# Patient Record
Sex: Male | Born: 1965 | Race: White | Hispanic: No | Marital: Married | Smoking: Former smoker
Health system: Southern US, Community
[De-identification: ages and names within clinical notes are randomized; demographics above are authoritative.]

## PROBLEM LIST (undated history)

## (undated) DIAGNOSIS — K219 Gastro-esophageal reflux disease without esophagitis: Secondary | ICD-10-CM

## (undated) DIAGNOSIS — F411 Generalized anxiety disorder: Secondary | ICD-10-CM

## (undated) DIAGNOSIS — E041 Nontoxic single thyroid nodule: Secondary | ICD-10-CM

## (undated) DIAGNOSIS — I10 Essential (primary) hypertension: Secondary | ICD-10-CM

## (undated) DIAGNOSIS — I671 Cerebral aneurysm, nonruptured: Secondary | ICD-10-CM

## (undated) HISTORY — DX: Cerebral aneurysm, nonruptured: I67.1

## (undated) HISTORY — DX: Nontoxic single thyroid nodule: E04.1

## (undated) HISTORY — DX: Essential (primary) hypertension: I10

## (undated) HISTORY — PX: VASECTOMY: SHX75

## (undated) HISTORY — DX: Gastro-esophageal reflux disease without esophagitis: K21.9

## (undated) HISTORY — DX: Generalized anxiety disorder: F41.1

---

## 2003-03-03 DIAGNOSIS — I671 Cerebral aneurysm, nonruptured: Secondary | ICD-10-CM

## 2003-03-03 HISTORY — DX: Cerebral aneurysm, nonruptured: I67.1

## 2013-05-05 DIAGNOSIS — H524 Presbyopia: Secondary | ICD-10-CM | POA: Insufficient documentation

## 2013-05-05 DIAGNOSIS — I671 Cerebral aneurysm, nonruptured: Secondary | ICD-10-CM | POA: Insufficient documentation

## 2013-05-05 HISTORY — DX: Presbyopia: H52.4

## 2013-07-14 DIAGNOSIS — M5136 Other intervertebral disc degeneration, lumbar region: Secondary | ICD-10-CM

## 2013-07-14 DIAGNOSIS — M51369 Other intervertebral disc degeneration, lumbar region without mention of lumbar back pain or lower extremity pain: Secondary | ICD-10-CM

## 2013-07-14 DIAGNOSIS — M545 Low back pain, unspecified: Secondary | ICD-10-CM | POA: Insufficient documentation

## 2013-07-14 HISTORY — DX: Other intervertebral disc degeneration, lumbar region: M51.36

## 2013-07-14 HISTORY — DX: Low back pain, unspecified: M54.50

## 2013-07-14 HISTORY — DX: Other intervertebral disc degeneration, lumbar region without mention of lumbar back pain or lower extremity pain: M51.369

## 2013-08-25 DIAGNOSIS — M47816 Spondylosis without myelopathy or radiculopathy, lumbar region: Secondary | ICD-10-CM

## 2013-08-25 HISTORY — DX: Spondylosis without myelopathy or radiculopathy, lumbar region: M47.816

## 2015-12-10 ENCOUNTER — Other Ambulatory Visit: Payer: Self-pay | Admitting: Physical Medicine and Rehabilitation

## 2015-12-10 DIAGNOSIS — M5416 Radiculopathy, lumbar region: Secondary | ICD-10-CM

## 2015-12-10 DIAGNOSIS — Z77018 Contact with and (suspected) exposure to other hazardous metals: Secondary | ICD-10-CM

## 2015-12-17 ENCOUNTER — Ambulatory Visit
Admission: RE | Admit: 2015-12-17 | Discharge: 2015-12-17 | Disposition: A | Discharge status: Home / Self Care | Payer: BLUE CROSS/BLUE SHIELD | Source: Ambulatory Visit | Attending: Physical Medicine and Rehabilitation | Admitting: Physical Medicine and Rehabilitation

## 2015-12-17 DIAGNOSIS — M5416 Radiculopathy, lumbar region: Secondary | ICD-10-CM

## 2015-12-17 DIAGNOSIS — Z77018 Contact with and (suspected) exposure to other hazardous metals: Secondary | ICD-10-CM

## 2017-12-27 DIAGNOSIS — I10 Essential (primary) hypertension: Secondary | ICD-10-CM | POA: Insufficient documentation

## 2018-04-15 DIAGNOSIS — E559 Vitamin D deficiency, unspecified: Secondary | ICD-10-CM | POA: Insufficient documentation

## 2018-04-15 DIAGNOSIS — F411 Generalized anxiety disorder: Secondary | ICD-10-CM | POA: Insufficient documentation

## 2018-04-15 HISTORY — DX: Vitamin D deficiency, unspecified: E55.9

## 2018-07-15 IMAGING — CR DG ORBITS FOR FOREIGN BODY
2 series · 2 of 2 positions shown · non-contrast
Comparison: No prior.

CLINICAL DATA: MRI screen.

EXAM:
ORBITS FOR FOREIGN BODY - 2 VIEW

[w orbit pa (1 of 2)]
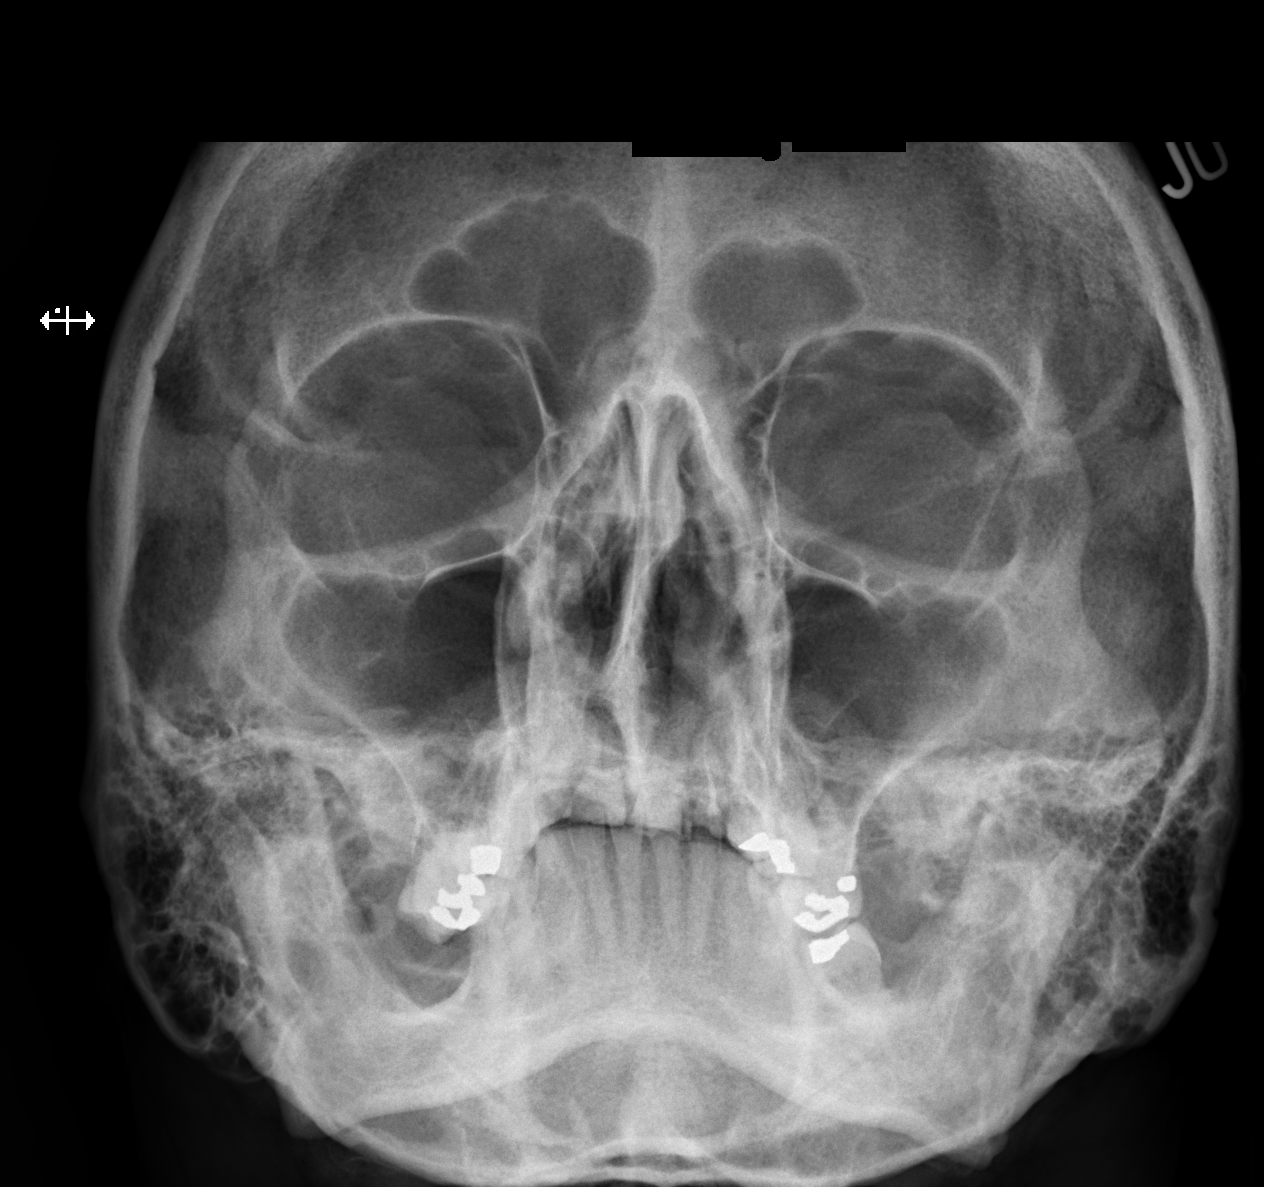

[w orbit pa (2 of 2)]
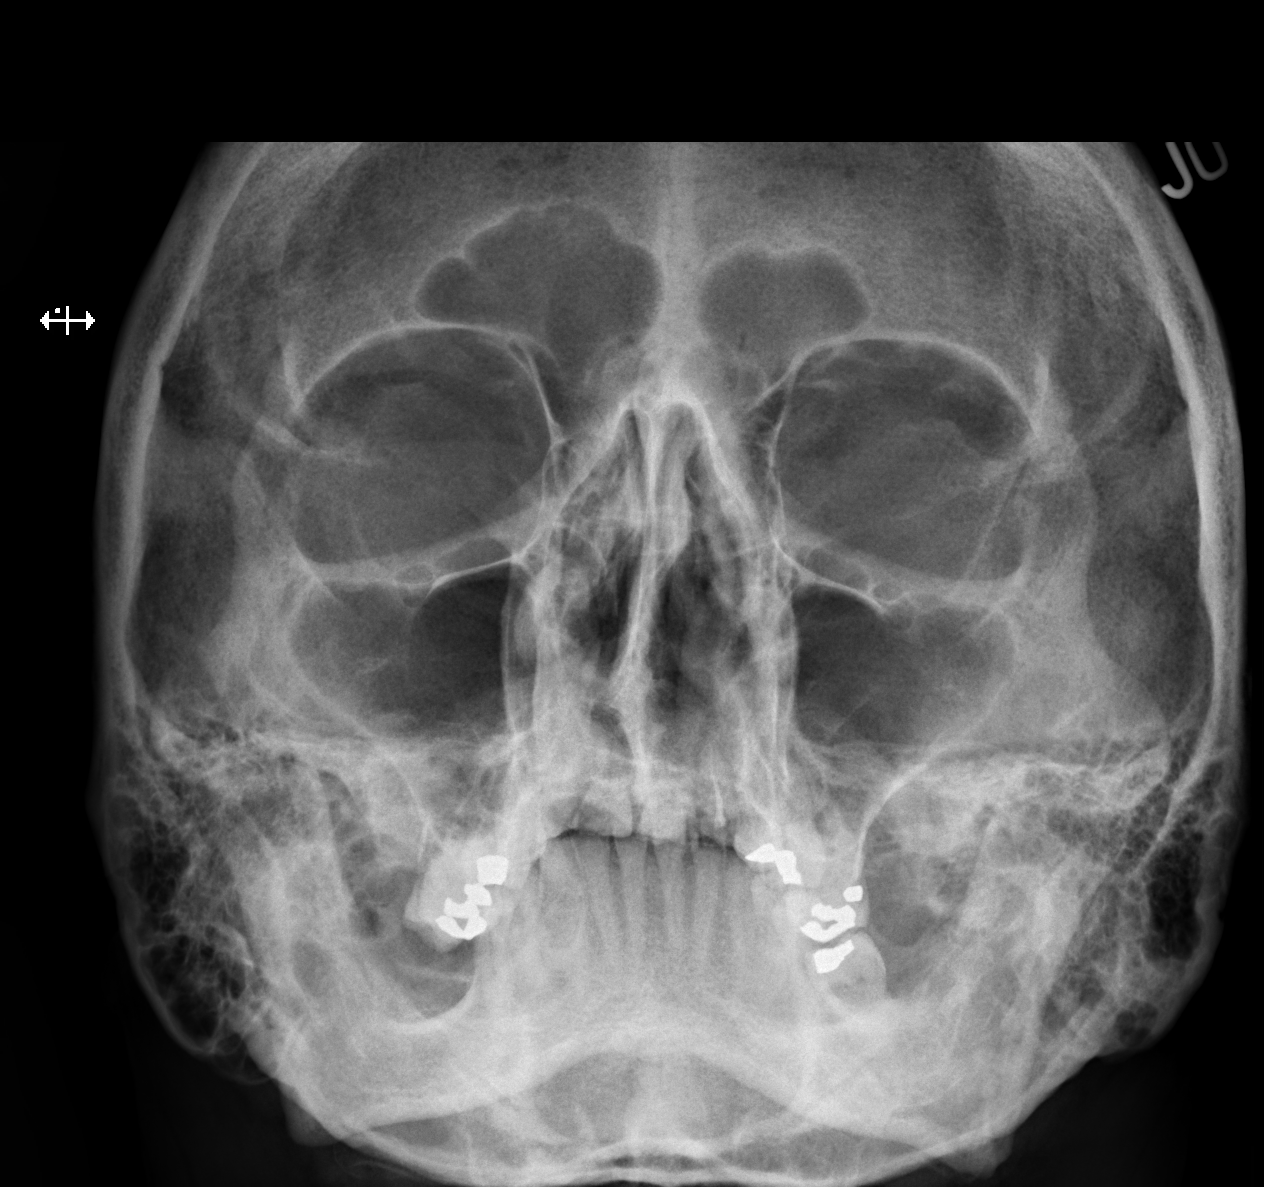

[2 of 2 positions shown; findings below may reference images not displayed]

FINDINGS: No metallic foreign bodies noted.  Patient cleared for MRI.
IMPRESSION: No evidence of metallic foreign body within the orbits.

## 2019-05-04 DIAGNOSIS — G44219 Episodic tension-type headache, not intractable: Secondary | ICD-10-CM | POA: Insufficient documentation

## 2019-05-04 HISTORY — DX: Episodic tension-type headache, not intractable: G44.219

## 2019-05-05 ENCOUNTER — Ambulatory Visit: Payer: BLUE CROSS/BLUE SHIELD | Admitting: Legal Medicine

## 2019-06-02 ENCOUNTER — Ambulatory Visit (INDEPENDENT_AMBULATORY_CARE_PROVIDER_SITE_OTHER): Payer: 59 | Admitting: Legal Medicine

## 2019-06-02 ENCOUNTER — Encounter: Payer: Self-pay | Admitting: Legal Medicine

## 2019-06-02 ENCOUNTER — Other Ambulatory Visit: Payer: Self-pay

## 2019-06-02 VITALS — BP 150/98 | HR 73 | Temp 98.1°F | Wt 247.0 lb

## 2019-06-02 DIAGNOSIS — R072 Precordial pain: Secondary | ICD-10-CM

## 2019-06-02 DIAGNOSIS — R42 Dizziness and giddiness: Secondary | ICD-10-CM

## 2019-06-02 DIAGNOSIS — I739 Peripheral vascular disease, unspecified: Secondary | ICD-10-CM

## 2019-06-02 DIAGNOSIS — I671 Cerebral aneurysm, nonruptured: Secondary | ICD-10-CM | POA: Diagnosis not present

## 2019-06-02 DIAGNOSIS — K219 Gastro-esophageal reflux disease without esophagitis: Secondary | ICD-10-CM | POA: Diagnosis not present

## 2019-06-02 DIAGNOSIS — I10 Essential (primary) hypertension: Secondary | ICD-10-CM

## 2019-06-02 DIAGNOSIS — F411 Generalized anxiety disorder: Secondary | ICD-10-CM

## 2019-06-02 DIAGNOSIS — E041 Nontoxic single thyroid nodule: Secondary | ICD-10-CM

## 2019-06-02 MED ORDER — NITROGLYCERIN 0.4 MG SL SUBL: 0.4000 mg | SUBLINGUAL_TABLET | SUBLINGUAL | 3 refills | Status: AC | PRN

## 2019-06-02 MED ORDER — MECLIZINE HCL 25 MG PO TABS: 25.0000 mg | ORAL_TABLET | Freq: Three times a day (TID) | ORAL | 2 refills | Status: DC | PRN

## 2019-06-02 MED ORDER — LISINOPRIL 20 MG PO TABS: 20.0000 mg | ORAL_TABLET | Freq: Every day | ORAL | 6 refills | Status: DC

## 2019-06-02 MED ORDER — ALPRAZOLAM 0.25 MG PO TABS: 0.2500 mg | ORAL_TABLET | Freq: Two times a day (BID) | ORAL | 2 refills | Status: DC | PRN

## 2019-06-02 MED ORDER — CITALOPRAM HYDROBROMIDE 20 MG PO TABS: 20.0000 mg | ORAL_TABLET | Freq: Every day | ORAL | 6 refills | Status: DC

## 2019-06-02 NOTE — Patient Instructions (Signed)

## 2019-06-02 NOTE — Progress Notes (Signed)
New Patient Office Visit  Subjective:  Patient ID: Alexander Hicks, male    DOB: February 24, 1966  Age: 54 y.o. MRN: [id number]  CC:  Chief Complaint  Patient presents with  . Anxiety  . Hypertension    HPI Alexander Hicks presents for re-establish- all old records reviewed.  Patient presents for follow up of hypertension.  Patient tolerating he stopped well with side effects.  Patient was diagnosed with hypertension 2010 so has been treated for hypertension for 10 years.Patient is working on maintaining diet and exercise regimen and follows up as directed. Complication include none.  This patient has major depression for years.  PHQ9 =12.  Patient is having more anhedonia.  The patient has less future plans and prospects.  The depression is worse with stress.  The patient is not exercising and working on behavior to improve mental health.  Patient is not seeing a therapist or psychiatrist.  na  Patient is on celexa.  His neck hurts for few days.  He has slower movements.  His thinking is slow.  Unstable gait. He has done a lot of work outside but denies injury.  No radicular symptoms.  Past Medical History:  Diagnosis Date  . Brain aneurysm   . Essential hypertension   . Gastro-esophageal reflux disease without esophagitis   . Generalized anxiety disorder   . GERD (gastroesophageal reflux disease)   . Nontoxic single thyroid nodule     Past Surgical History:  Procedure Laterality Date  . VASECTOMY      Family History  Problem Relation Age of Onset  . Hypertension Mother   . Pneumonia Mother   . Hypertension Father   . Cancer Father   . Cancer Sister   . Hypertension Brother     Social History   Socioeconomic History  . Marital status: Married    Spouse name: Not on file  . Number of children: Not on file  . Years of education: Not on file  . Highest education level: Not on file  Occupational History  . Not on file  Tobacco Use  . Smoking status: Never Smoker    . Smokeless tobacco: Current User    Types: Snuff  Substance and Sexual Activity  . Alcohol use: Yes    Comment: rarely  . Drug use: Never  . Sexual activity: Not on file  Other Topics Concern  . Not on file  Social History Narrative  . Not on file   Social Determinants of Health   Financial Resource Strain:   . Difficulty of Paying Living Expenses:   Food Insecurity:   . Worried About Charity fundraiser in the Last Year:   . Arboriculturist in the Last Year:   Transportation Needs:   . Film/video editor (Medical):   Marland Kitchen Lack of Transportation (Non-Medical):   Physical Activity:   . Days of Exercise per Week:   . Minutes of Exercise per Session:   Stress:   . Feeling of Stress :   Social Connections:   . Frequency of Communication with Friends and Family:   . Frequency of Social Gatherings with Friends and Family:   . Attends Religious Services:   . Active Member of Clubs or Organizations:   . Attends Archivist Meetings:   Marland Kitchen Marital Status:   Intimate Partner Violence:   . Fear of Current or Ex-Partner:   . Emotionally Abused:   Marland Kitchen Physically Abused:   . Sexually Abused:  ROS Review of Systems  Constitutional: Negative.   HENT: Negative.   Eyes: Negative.   Respiratory: Negative.   Cardiovascular: Negative.   Gastrointestinal: Negative.   Genitourinary: Negative.   Musculoskeletal: Negative.   Skin: Negative.   Neurological: Positive for dizziness.  Psychiatric/Behavioral: Negative.     Objective:   Today's Vitals: BP (!) 150/98   Pulse 73   Temp 98.1 F (36.7 C)   Wt 247 lb (112 kg)   SpO2 95%   Physical Exam Vitals reviewed.  Constitutional:      Appearance: Normal appearance. He is obese.  HENT:     Head: Normocephalic and atraumatic.  Eyes:     Extraocular Movements: Extraocular movements intact.     Right eye: Nystagmus present.     Left eye: Nystagmus present.     Pupils: Pupils are equal, round, and reactive to  light.  Cardiovascular:     Rate and Rhythm: Normal rate and regular rhythm.     Pulses: Normal pulses.     Heart sounds: Normal heart sounds.  Pulmonary:     Breath sounds: Normal breath sounds.  Abdominal:     General: Abdomen is flat.     Palpations: Abdomen is soft.  Musculoskeletal:     Cervical back: Normal range of motion and neck supple.  Neurological:     Mental Status: He is alert.     Gait: Gait abnormal.     Comments: Unstable rhomberg, positive, past pointing.     Assessment & Plan:   Problem List Items Addressed This Visit      Cardiovascular and Mediastinum   Brain aneurysm    Many years a go.  No recurrence.  No focal neurologic signs.  If patient worsens, then get CT head.      Relevant Medications   lisinopril (ZESTRIL) 20 MG tablet   nitroGLYCERIN (NITROSTAT) 0.4 MG SL tablet   Other Relevant Orders   Lipid Panel (Completed)   Essential hypertension - Primary    An individual hypertension care plan was established and reinforced today.  The patient's status was assessed using clinical findings on exam and labs or diagnostic tests. The patient's success at meeting treatment goals on disease specific evidence-based guidelines and found to be poor controlled. SELF MANAGEMENT: The patient and I together assessed ways to personally work towards obtaining the recommended goals. RECOMMENDATIONS: avoid decongestants found in common cold remedies, decrease consumption of alcohol, perform routine monitoring of BP with home BP cuff, exercise, reduction of dietary salt, take medicines as prescribed, try not to miss doses and quit smoking.  Regular exercise and maintaining a healthy weight is needed.  Stress reduction may help. A CLINICAL SUMMARY including written plan identify barriers to care unique to individual due to social or financial issues.  We attempt to mutually creat solutions for individual and family understanding.Reatart his BP medicines.      Relevant  Medications   lisinopril (ZESTRIL) 20 MG tablet   nitroGLYCERIN (NITROSTAT) 0.4 MG SL tablet   Other Relevant Orders   CBC with Differential (Completed)   Comprehensive metabolic panel (Completed)     Digestive   Gastro-esophageal reflux disease without esophagitis    Plan of care was formulated today for GERD.  She is doing well.  A plan of care was formulated using patient exam, tests and other sources to optimize care using evidence based information.  Recommend no smoking, no eating after supper, avoid fatty foods, elevate Head of bed, avoid tight fitting  clothing.  Continue on BP medicines.      Relevant Medications   meclizine (ANTIVERT) 25 MG tablet     Endocrine   Nontoxic single thyroid nodule    Patient has no symptoms of hypothyroidism.        Other   Generalized anxiety disorder    AN INDIVIDUAL CARE PLAN for anxiety/depression was established and reinforced today.  The patient's status was assessed using clinical findings on exam, labs, and other diagnostic testing. Patient's success at meeting treatment goals based on disease specific evidence-bassed guidelines and found to be in fair control. RECOMMENDATIONS include restart medicines present medicines and treatment. Use meclizine for dizziness.      Relevant Medications   ALPRAZolam (XANAX) 0.25 MG tablet   citalopram (CELEXA) 20 MG tablet    Other Visit Diagnoses    Peripheral vascular disease (HCC)       Relevant Medications   lisinopril (ZESTRIL) 20 MG tablet   nitroGLYCERIN (NITROSTAT) 0.4 MG SL tablet   Other Relevant Orders   VAS [place] ABI WITH/WO TBI   Precordial pain       Relevant Medications   nitroGLYCERIN (NITROSTAT) 0.4 MG SL tablet   Other Relevant Orders   EKG 12-Lead (Completed)   Ambulatory referral to Cardiology   Vertigo       Relevant Medications   meclizine (ANTIVERT) 25 MG tablet      Outpatient Encounter Medications as of 06/02/2019  Medication Sig  . citalopram (CELEXA) 20 MG  tablet Take 1 tablet (20 mg total) by mouth daily.  . nitroGLYCERIN (NITROSTAT) 0.4 MG SL tablet Place 1 tablet (0.4 mg total) under the tongue every 5 (five) minutes as needed.  . [DISCONTINUED] citalopram (CELEXA) 20 MG tablet Take 20 mg by mouth daily.  . [DISCONTINUED] nitroGLYCERIN (NITROSTAT) 0.4 MG SL tablet Place 0.4 mg under the tongue every 5 (five) minutes as needed.  . ALPRAZolam (XANAX) 0.25 MG tablet Take 1 tablet (0.25 mg total) by mouth 2 (two) times daily as needed for anxiety.  Marland Kitchen lisinopril (ZESTRIL) 20 MG tablet Take 1 tablet (20 mg total) by mouth daily.  . meclizine (ANTIVERT) 25 MG tablet Take 1 tablet (25 mg total) by mouth 3 (three) times daily as needed for dizziness.  . [DISCONTINUED] albuterol (VENTOLIN HFA) 108 (90 Base) MCG/ACT inhaler Inhale into the lungs.  . [DISCONTINUED] ALPRAZolam (XANAX) 0.25 MG tablet 1 tablet once as needed.  . [DISCONTINUED] aspirin 81 MG chewable tablet Chew by mouth.  . [DISCONTINUED] citalopram (CELEXA) 10 MG tablet Take by mouth.  . [DISCONTINUED] lisinopril (ZESTRIL) 20 MG tablet 1 tablet once daily.   No facility-administered encounter medications on file as of 06/02/2019.    Follow-up: Return in about 2 weeks (around 06/16/2019).   Brent Bulla, MD

## 2019-06-03 LAB — COMPREHENSIVE METABOLIC PANEL
ALT: 30 IU/L (ref 0–44)
AST: 22 IU/L (ref 0–40)
Albumin/Globulin Ratio: 1.9 (ref 1.2–2.2)
Albumin: 4.6 g/dL (ref 3.8–4.9)
Alkaline Phosphatase: 83 IU/L (ref 39–117)
BUN/Creatinine Ratio: 14 (ref 9–20)
BUN: 11 mg/dL (ref 6–24)
Bilirubin Total: 0.5 mg/dL (ref 0.0–1.2)
CO2: 28 mmol/L (ref 20–29)
Calcium: 9.4 mg/dL (ref 8.7–10.2)
Chloride: 101 mmol/L (ref 96–106)
Creatinine, Ser: 0.81 mg/dL (ref 0.76–1.27)
GFR calc Af Amer: 115 mL/min/{1.73_m2} (ref >59)
GFR calc non Af Amer: 99 mL/min/{1.73_m2} (ref >59)
Globulin, Total: 2.4 g/dL (ref 1.5–4.5)
Glucose: 97 mg/dL (ref 65–99)
Potassium: 4.4 mmol/L (ref 3.5–5.2)
Sodium: 140 mmol/L (ref 134–144)
Total Protein: 7 g/dL (ref 6.0–8.5)

## 2019-06-03 LAB — CBC WITH DIFFERENTIAL/PLATELET
Basophils Absolute: 0.1 10*3/uL (ref 0.0–0.2)
Basos: 1 %
EOS (ABSOLUTE): 0.2 10*3/uL (ref 0.0–0.4)
Eos: 2 %
Hematocrit: 45.7 % (ref 37.5–51.0)
Hemoglobin: 15.2 g/dL (ref 13.0–17.7)
Immature Grans (Abs): 0 10*3/uL (ref 0.0–0.1)
Immature Granulocytes: 1 %
Lymphocytes Absolute: 1.7 10*3/uL (ref 0.7–3.1)
Lymphs: 22 %
MCH: 28.3 pg (ref 26.6–33.0)
MCHC: 33.3 g/dL (ref 31.5–35.7)
MCV: 85 fL (ref 79–97)
Monocytes Absolute: 0.6 10*3/uL (ref 0.1–0.9)
Monocytes: 8 %
Neutrophils Absolute: 5 10*3/uL (ref 1.4–7.0)
Neutrophils: 66 %
Platelets: 317 10*3/uL (ref 150–450)
RBC: 5.38 x10E6/uL (ref 4.14–5.80)
RDW: 12.7 % (ref 11.6–15.4)
WBC: 7.6 10*3/uL (ref 3.4–10.8)

## 2019-06-03 LAB — LIPID PANEL
Chol/HDL Ratio: 4.2 ratio (ref 0.0–5.0)
Cholesterol, Total: 160 mg/dL (ref 100–199)
HDL: 38 mg/dL — ABNORMAL LOW (ref >39)
LDL Chol Calc (NIH): 96 mg/dL (ref 0–99)
Triglycerides: 150 mg/dL — ABNORMAL HIGH (ref 0–149)
VLDL Cholesterol Cal: 26 mg/dL (ref 5–40)

## 2019-06-03 LAB — CARDIOVASCULAR RISK ASSESSMENT

## 2019-06-03 NOTE — Assessment & Plan Note (Signed)
Many years a go.  No recurrence.  No focal neurologic signs.  If patient worsens, then get CT head.

## 2019-06-03 NOTE — Assessment & Plan Note (Signed)
Plan of care was formulated today for GERD.  She is doing well.  A plan of care was formulated using patient exam, tests and other sources to optimize care using evidence based information.  Recommend no smoking, no eating after supper, avoid fatty foods, elevate Head of bed, avoid tight fitting clothing.  Continue on BP medicines.

## 2019-06-03 NOTE — Assessment & Plan Note (Signed)
AN INDIVIDUAL CARE PLAN for anxiety/depression was established and reinforced today.  The patient's status was assessed using clinical findings on exam, labs, and other diagnostic testing. Patient's success at meeting treatment goals based on disease specific evidence-bassed guidelines and found to be in fair control. RECOMMENDATIONS include restart medicines present medicines and treatment. Use meclizine for dizziness.

## 2019-06-03 NOTE — Assessment & Plan Note (Signed)
An individual hypertension care plan was established and reinforced today.  The patient's status was assessed using clinical findings on exam and labs or diagnostic tests. The patient's success at meeting treatment goals on disease specific evidence-based guidelines and found to be poor controlled. SELF MANAGEMENT: The patient and I together assessed ways to personally work towards obtaining the recommended goals. RECOMMENDATIONS: avoid decongestants found in common cold remedies, decrease consumption of alcohol, perform routine monitoring of BP with home BP cuff, exercise, reduction of dietary salt, take medicines as prescribed, try not to miss doses and quit smoking.  Regular exercise and maintaining a healthy weight is needed.  Stress reduction may help. A CLINICAL SUMMARY including written plan identify barriers to care unique to individual due to social or financial issues.  We attempt to mutually creat solutions for individual and family understanding.Reatart his BP medicines.

## 2019-06-03 NOTE — Assessment & Plan Note (Signed)
Patient has no symptoms of hypothyroidism.

## 2019-06-16 ENCOUNTER — Other Ambulatory Visit: Payer: Self-pay

## 2019-06-16 ENCOUNTER — Ambulatory Visit (INDEPENDENT_AMBULATORY_CARE_PROVIDER_SITE_OTHER): Payer: 59 | Admitting: Cardiology

## 2019-06-16 ENCOUNTER — Encounter: Payer: Self-pay | Admitting: Cardiology

## 2019-06-16 ENCOUNTER — Ambulatory Visit: Payer: 59 | Admitting: Legal Medicine

## 2019-06-16 VITALS — BP 124/92 | HR 70 | Ht 69.0 in | Wt 249.0 lb

## 2019-06-16 DIAGNOSIS — R079 Chest pain, unspecified: Secondary | ICD-10-CM

## 2019-06-16 DIAGNOSIS — R0600 Dyspnea, unspecified: Secondary | ICD-10-CM

## 2019-06-16 DIAGNOSIS — I10 Essential (primary) hypertension: Secondary | ICD-10-CM

## 2019-06-16 DIAGNOSIS — Z8249 Family history of ischemic heart disease and other diseases of the circulatory system: Secondary | ICD-10-CM

## 2019-06-16 DIAGNOSIS — E669 Obesity, unspecified: Secondary | ICD-10-CM

## 2019-06-16 HISTORY — DX: Chest pain, unspecified: R07.9

## 2019-06-16 HISTORY — DX: Dyspnea, unspecified: R06.00

## 2019-06-16 HISTORY — DX: Family history of ischemic heart disease and other diseases of the circulatory system: Z82.49

## 2019-06-16 NOTE — Progress Notes (Signed)
Cardiology Office Note:    Date:  06/16/2019   ID:  Alexander Hicks, DOB 1965/10/05, MRN [id number]  PCP:  Lillard Anes, MD  Cardiologist:  Berniece Salines, DO  Electrophysiologist:  None   Referring MD: Lillard Anes,*   Chief Complaint  Patient presents with  . Chest Pain    History of Present Illness:    Alexander Hicks is a 54 y.o. male with a hx of hypertension, history of significant premature coronary artery disease, obesity, tobacco use presents today to be evaluated for chest pain.  Patient tells me that he has been experiencing intermittent chest tightness sometimes feel like a burning sensation.  He says is mostly midsternal sometimes he says discharge from the epigastric area.  Recently he notes that he has been experiencing shortness of breath with this.  He is concerned given the fact that his brother had a myocardial infarction.  No other complaints at this time.  Past Medical History:  Diagnosis Date  . Brain aneurysm   . Essential hypertension   . Gastro-esophageal reflux disease without esophagitis   . Generalized anxiety disorder   . GERD (gastroesophageal reflux disease)   . Nontoxic single thyroid nodule     Past Surgical History:  Procedure Laterality Date  . VASECTOMY      Current Medications: Current Meds  Medication Sig  . ALPRAZolam (XANAX) 0.25 MG tablet Take 1 tablet (0.25 mg total) by mouth 2 (two) times daily as needed for anxiety.  . APPLE CIDER VINEGAR PO Take by mouth daily. Goli gummies  . aspirin EC 81 MG tablet Take 81 mg by mouth daily.  . citalopram (CELEXA) 20 MG tablet Take 1 tablet (20 mg total) by mouth daily. (Patient taking differently: Take 10 mg by mouth daily. )  . lisinopril (ZESTRIL) 20 MG tablet Take 1 tablet (20 mg total) by mouth daily.  . meclizine (ANTIVERT) 25 MG tablet Take 1 tablet (25 mg total) by mouth 3 (three) times daily as needed for dizziness.  . Multiple Vitamins-Minerals (EMERGEN-C IMMUNE  PO) Take by mouth daily.  . nitroGLYCERIN (NITROSTAT) 0.4 MG SL tablet Place 1 tablet (0.4 mg total) under the tongue every 5 (five) minutes as needed.     Allergies:   Hydromorphone, Penicillins, and Phenytoin sodium extended   Social History   Socioeconomic History  . Marital status: Married    Spouse name: Not on file  . Number of children: Not on file  . Years of education: Not on file  . Highest education level: Not on file  Occupational History  . Not on file  Tobacco Use  . Smoking status: Never Smoker  . Smokeless tobacco: Current User    Types: Snuff  Substance and Sexual Activity  . Alcohol use: Never    Comment: rarely  . Drug use: Never  . Sexual activity: Not on file  Other Topics Concern  . Not on file  Social History Narrative  . Not on file   Social Determinants of Health   Financial Resource Strain:   . Difficulty of Paying Living Expenses:   Food Insecurity:   . Worried About Charity fundraiser in the Last Year:   . Arboriculturist in the Last Year:   Transportation Needs:   . Film/video editor (Medical):   Marland Kitchen Lack of Transportation (Non-Medical):   Physical Activity:   . Days of Exercise per Week:   . Minutes of Exercise per Session:  Stress:   . Feeling of Stress :   Social Connections:   . Frequency of Communication with Friends and Family:   . Frequency of Social Gatherings with Friends and Family:   . Attends Religious Services:   . Active Member of Clubs or Organizations:   . Attends Banker Meetings:   Marland Kitchen Marital Status:      Family History: The patient's family history includes Cancer in his father and sister; Hypertension in his brother, father, and mother; Pneumonia in his mother.  ROS:   Review of Systems  Constitution: Negative for decreased appetite, fever and weight gain.  HENT: Negative for congestion, ear discharge, hoarse voice and sore throat.   Eyes: Negative for discharge, redness, vision loss in  right eye and visual halos.  Cardiovascular: Reports chest pain, dyspnea on exertion. Negative leg swelling, orthopnea and palpitations.  Respiratory: Negative for cough, hemoptysis, shortness of breath and snoring.   Endocrine: Negative for heat intolerance and polyphagia.  Hematologic/Lymphatic: Negative for bleeding problem. Does not bruise/bleed easily.  Skin: Negative for flushing, nail changes, rash and suspicious lesions.  Musculoskeletal: Negative for arthritis, joint pain, muscle cramps, myalgias, neck pain and stiffness.  Gastrointestinal: Negative for abdominal pain, bowel incontinence, diarrhea and excessive appetite.  Genitourinary: Negative for decreased libido, genital sores and incomplete emptying.  Neurological: Negative for brief paralysis, focal weakness, headaches and loss of balance.  Psychiatric/Behavioral: Negative for altered mental status, depression and suicidal ideas.  Allergic/Immunologic: Negative for HIV exposure and persistent infections.    EKGs/Labs/Other Studies Reviewed:    The following studies were reviewed today:  EKG:  The ekg ordered today demonstrates rhythm, heart rate 72 bpm nonspecific ST changes similar to EKG done at his PCP office on 06/02/2019.  Recent Labs: 06/02/2019: ALT 30; BUN 11; Creatinine, Ser 0.81; Hemoglobin 15.2; Platelets 317; Potassium 4.4; Sodium 140  Recent Lipid Panel    Component Value Date/Time   CHOL 160 06/02/2019 1126   TRIG 150 (H) 06/02/2019 1126   HDL 38 (L) 06/02/2019 1126   CHOLHDL 4.2 06/02/2019 1126   LDLCALC 96 06/02/2019 1126    Physical Exam:    VS:  BP (!) 124/92 (BP Location: Left Arm, Patient Position: Sitting, Cuff Size: Large)   Pulse 70   Ht 5\' 9"  (1.753 m)   Wt 249 lb (112.9 kg)   SpO2 95%   BMI 36.77 kg/m     Wt Readings from Last 3 Encounters:  06/16/19 249 lb (112.9 kg)  06/02/19 247 lb (112 kg)     GEN: Well nourished, well developed in no acute distress HEENT: Normal NECK: No JVD;  No carotid bruits LYMPHATICS: No lymphadenopathy CARDIAC: S1S2 noted,RRR, no murmurs, rubs, gallops RESPIRATORY:  Clear to auscultation without rales, wheezing or rhonchi  ABDOMEN: Soft, non-tender, non-distended, +bowel sounds, no guarding. EXTREMITIES: Bilateral trace extremity edema, No cyanosis, no clubbing MUSCULOSKELETAL:  No deformity  SKIN: Warm and dry NEUROLOGIC:  Alert and oriented x 3, non-focal PSYCHIATRIC:  Normal affect, good insight  ASSESSMENT:    1. Chest pain, unspecified type   2. Dyspnea, unspecified type   3. Essential hypertension   4. Family history of premature CAD   5. Obesity (BMI 30.0-34.9)    PLAN:    The patient chest pain is concerning and he is at intermediate risk therefore like to proceed with ischemic evaluation.  We will proceed with pharmacologic nuclear stress test.  Have educated the patient about this he is agreeable to undergo testing.  In the meantime his PCP does not nitroglycerin for him.  I have again educated patient how to use the nitroglycerin and also to call 911 if chest pain persists.  In the setting of his new onset shortness of breath, a  transthoracic echocardiogram will be appropriate to assess right ventricular/left ventricular systolic function.  This will also give me information about his valves as well as other structural abnormalities.   Hypertension-he notes that he stopped his medication as he was trying to wean himself off his antihypertensive medication but his blood pressure went up and he has been restarted on his lisinopril 20 mg daily.  His blood pressure today in the office with mild diastolic hypertension.  Educated patient on decreasing salt intake and he is looking into now changing his diet for more healthier choices.  The patient understands the need to lose weight with diet and exercise. We have discussed specific strategies for this.  Tobacco use-counseled patient against use of chewing tobacco.  The patient  is in agreement with the above plan. The patient left the office in stable condition.  The patient will follow up in 3 months or sooner if needed.   Medication Adjustments/Labs and Tests Ordered: Current medicines are reviewed at length with the patient today.  Concerns regarding medicines are outlined above.  Orders Placed This Encounter  Procedures  . MYOCARDIAL PERFUSION IMAGING  . EKG 12-Lead  . ECHOCARDIOGRAM COMPLETE   No orders of the defined types were placed in this encounter.   Patient Instructions  Medication Instructions:   Your physician recommends that you continue on your current medications as directed. Please refer to the Current Medication list given to you today.  *If you need a refill on your cardiac medications before your next appointment, please call your pharmacy*   Lab Work: NONE ORDERED  TODAY   If you have labs (blood work) drawn today and your tests are completely normal, you will receive your results only by: Marland Kitchen MyChart Message (if you have MyChart) OR . A paper copy in the mail If you have any lab test that is abnormal or we need to change your treatment, we will call you to review the results.   Testing/Procedures:  Your physician has requested that you have an echocardiogram. Echocardiography is a painless test that uses sound waves to create images of your heart. It provides your doctor with information about the size and shape of your heart and how well your heart's chambers and valves are working. This procedure takes approximately one hour. There are no restrictions for this procedure.  Your physician has requested that you have a lexiscan myoview. For further information please visit [email or web address] Please follow instruction sheet, as given.   Follow-Up: At [facility], you and your health needs are our priority.  As part of our continuing mission to provide you with exceptional heart care, we have created designated Provider Care Teams.   These Care Teams include your primary Cardiologist (physician) and Advanced Practice Providers (APPs -  Physician Assistants and Nurse Practitioners) who all work together to provide you with the care you need, when you need it.  We recommend signing up for the patient portal called "MyChart".  Sign up information is provided on this After Visit Summary.  MyChart is used to connect with patients for Virtual Visits (Telemedicine).  Patients are able to view lab/test results, encounter notes, upcoming appointments, etc.  Non-urgent messages can be sent to your provider as well.   To  learn more about what you can do with MyChart, go to [email or web address].    Your next appointment:   3 month(s)  The format for your next appointment:   In Person  Provider:   You will see  Dr. Servando Salina Or, you can be scheduled with the following Advanced Practice Provider on your designated Care Team (at our [facility]):  Gillian Shields, FNP    Other Instructions      Adopting a Healthy Lifestyle.  Know what a healthy weight is for you (roughly BMI <25) and aim to maintain this   Aim for 7+ servings of fruits and vegetables daily   65-80+ fluid ounces of water or unsweet tea for healthy kidneys   Limit to max 1 drink of alcohol per day; avoid smoking/tobacco   Limit animal fats in diet for cholesterol and heart health - choose grass fed whenever available   Avoid highly processed foods, and foods high in saturated/trans fats   Aim for low stress - take time to unwind and care for your mental health   Aim for 150 min of moderate intensity exercise weekly for heart health, and weights twice weekly for bone health   Aim for 7-9 hours of sleep daily   When it comes to diets, agreement about the perfect plan isnt easy to find, even among the experts. Experts at the [facility] [employer] developed an idea known as the Healthy Eating Plate. Just imagine a plate divided into logical,  healthy portions.   The emphasis is on diet quality:   Load up on vegetables and fruits - one-half of your plate: Aim for color and variety, and remember that potatoes dont count.   Go for whole grains - one-quarter of your plate: Whole wheat, barley, wheat berries, quinoa, oats, brown rice, and foods made with them. If you want pasta, go with whole wheat pasta.   Protein power - one-quarter of your plate: Fish, chicken, beans, and nuts are all healthy, versatile protein sources. Limit red meat.   The diet, however, does go beyond the plate, offering a few other suggestions.   Use healthy plant oils, such as olive, canola, soy, corn, sunflower and peanut. Check the labels, and avoid partially hydrogenated oil, which have unhealthy trans fats.   If youre thirsty, drink water. Coffee and tea are good in moderation, but skip sugary drinks and limit milk and dairy products to one or two daily servings.   The type of carbohydrate in the diet is more important than the amount. Some sources of carbohydrates, such as vegetables, fruits, whole grains, and beans-are healthier than others.   Finally, stay active  Signed, Thomasene Ripple, DO  06/16/2019 2:23 PM    [facility]

## 2019-06-16 NOTE — Patient Instructions (Signed)
Medication Instructions:   Your physician recommends that you continue on your current medications as directed. Please refer to the Current Medication list given to you today.  *If you need a refill on your cardiac medications before your next appointment, please call your pharmacy*   Lab Work: NONE ORDERED  TODAY   If you have labs (blood work) drawn today and your tests are completely normal, you will receive your results only by: Marland Kitchen MyChart Message (if you have MyChart) OR . A paper copy in the mail If you have any lab test that is abnormal or we need to change your treatment, we will call you to review the results.   Testing/Procedures:  Your physician has requested that you have an echocardiogram. Echocardiography is a painless test that uses sound waves to create images of your heart. It provides your doctor with information about the size and shape of your heart and how well your heart's chambers and valves are working. This procedure takes approximately one hour. There are no restrictions for this procedure.  Your physician has requested that you have a lexiscan myoview. For further information please visit [email or web address] Please follow instruction sheet, as given.   Follow-Up: At [facility], you and your health needs are our priority.  As part of our continuing mission to provide you with exceptional heart care, we have created designated Provider Care Teams.  These Care Teams include your primary Cardiologist (physician) and Advanced Practice Providers (APPs -  Physician Assistants and Nurse Practitioners) who all work together to provide you with the care you need, when you need it.  We recommend signing up for the patient portal called "MyChart".  Sign up information is provided on this After Visit Summary.  MyChart is used to connect with patients for Virtual Visits (Telemedicine).  Patients are able to view lab/test results, encounter notes, upcoming appointments, etc.   Non-urgent messages can be sent to your provider as well.   To learn more about what you can do with MyChart, go to [email or web address].    Your next appointment:   3 month(s)  The format for your next appointment:   In Person  Provider:   You will see  Dr. Servando Salina Or, you can be scheduled with the following Advanced Practice Provider on your designated Care Team (at our [facility]):  Gillian Shields, FNP    Other Instructions

## 2019-06-23 ENCOUNTER — Other Ambulatory Visit: Payer: Self-pay

## 2019-06-23 ENCOUNTER — Encounter: Payer: Self-pay | Admitting: Legal Medicine

## 2019-06-23 ENCOUNTER — Ambulatory Visit (INDEPENDENT_AMBULATORY_CARE_PROVIDER_SITE_OTHER): Payer: 59 | Admitting: Legal Medicine

## 2019-06-23 DIAGNOSIS — I10 Essential (primary) hypertension: Secondary | ICD-10-CM | POA: Diagnosis not present

## 2019-06-23 NOTE — Progress Notes (Signed)
Established Patient Office Visit  Subjective:  Patient ID: Alexander Hicks, male    DOB: 10-25-1965  Age: 54 y.o. MRN: [id number]  CC:  Chief Complaint  Patient presents with  . Hypertension    Patient is taking medication lisinopril everyday    HPI Alexander Hicks presents for recheck of BP.  He restarted his lisinopril. No problems.  Patient presents for follow up of hypertension.  Patient tolerating lisinopril well with side effects.  Patient was diagnosed with hypertension 2010 so has been treated for hypertension for 10 years.Patient is working on maintaining diet and exercise regimen and follows up as directed. Complication include none.  Past Medical History:  Diagnosis Date  . Brain aneurysm   . Essential hypertension   . Gastro-esophageal reflux disease without esophagitis   . Generalized anxiety disorder   . GERD (gastroesophageal reflux disease)   . Nontoxic single thyroid nodule     Past Surgical History:  Procedure Laterality Date  . VASECTOMY      Family History  Problem Relation Age of Onset  . Hypertension Mother   . Pneumonia Mother   . Hypertension Father   . Cancer Father   . Cancer Sister   . Hypertension Brother     Social History   Socioeconomic History  . Marital status: Married    Spouse name: Not on file  . Number of children: Not on file  . Years of education: Not on file  . Highest education level: Not on file  Occupational History  . Not on file  Tobacco Use  . Smoking status: Never Smoker  . Smokeless tobacco: Current User    Types: Snuff  Substance and Sexual Activity  . Alcohol use: Never    Comment: rarely  . Drug use: Never  . Sexual activity: Yes    Partners: Female  Other Topics Concern  . Not on file  Social History Narrative  . Not on file   Social Determinants of Health   Financial Resource Strain:   . Difficulty of Paying Living Expenses:   Food Insecurity:   . Worried About Charity fundraiser in the  Last Year:   . Arboriculturist in the Last Year:   Transportation Needs:   . Film/video editor (Medical):   Marland Kitchen Lack of Transportation (Non-Medical):   Physical Activity:   . Days of Exercise per Week:   . Minutes of Exercise per Session:   Stress:   . Feeling of Stress :   Social Connections:   . Frequency of Communication with Friends and Family:   . Frequency of Social Gatherings with Friends and Family:   . Attends Religious Services:   . Active Member of Clubs or Organizations:   . Attends Archivist Meetings:   Marland Kitchen Marital Status:   Intimate Partner Violence:   . Fear of Current or Ex-Partner:   . Emotionally Abused:   Marland Kitchen Physically Abused:   . Sexually Abused:     Outpatient Medications Prior to Visit  Medication Sig Dispense Refill  . ALPRAZolam (XANAX) 0.25 MG tablet Take 1 tablet (0.25 mg total) by mouth 2 (two) times daily as needed for anxiety. 30 tablet 2  . APPLE CIDER VINEGAR PO Take by mouth daily. Goli gummies    . aspirin EC 81 MG tablet Take 81 mg by mouth daily.    . citalopram (CELEXA) 20 MG tablet Take 1 tablet (20 mg total) by mouth daily. (Patient taking  differently: Take 10 mg by mouth daily. ) 30 tablet 6  . lisinopril (ZESTRIL) 20 MG tablet Take 1 tablet (20 mg total) by mouth daily. 30 tablet 6  . meclizine (ANTIVERT) 25 MG tablet Take 1 tablet (25 mg total) by mouth 3 (three) times daily as needed for dizziness. 30 tablet 2  . Multiple Vitamins-Minerals (EMERGEN-C IMMUNE PO) Take by mouth daily.    . nitroGLYCERIN (NITROSTAT) 0.4 MG SL tablet Place 1 tablet (0.4 mg total) under the tongue every 5 (five) minutes as needed. 25 tablet 3   No facility-administered medications prior to visit.    Allergies  Allergen Reactions  . Hydromorphone Anaphylaxis, Rash and Swelling    Swelling.    Marland Kitchen Penicillins Anaphylaxis and Swelling    Throat Hives as well.  Hives as well.    Marland Kitchen Phenytoin Sodium Extended Swelling    ROS Review of Systems    Constitutional: Negative.   HENT: Negative.   Eyes: Negative.   Respiratory: Negative.   Cardiovascular: Negative.   Gastrointestinal: Negative.   Endocrine: Negative.   Genitourinary: Negative.   Musculoskeletal: Negative.   Skin: Negative.   Neurological: Negative.   Psychiatric/Behavioral: Negative.       Objective:    Physical Exam  Constitutional: He appears well-developed and well-nourished.  HENT:  Head: Atraumatic.  Eyes: Pupils are equal, round, and reactive to light. Conjunctivae and EOM are normal.  Cardiovascular: Normal rate, regular rhythm, normal heart sounds and intact distal pulses.  Pulmonary/Chest: Effort normal and breath sounds normal.  Musculoskeletal:     Cervical back: Normal range of motion and neck supple.  Vitals reviewed.   BP 130/80 (BP Location: Right Arm, Patient Position: Sitting)   Pulse 70   Temp 97.6 F (36.4 C)   Resp 18   Ht 5\' 9"  (1.753 m)   Wt 249 lb (112.9 kg)   BMI 36.77 kg/m  Wt Readings from Last 3 Encounters:  06/23/19 249 lb (112.9 kg)  06/16/19 249 lb (112.9 kg)  06/02/19 247 lb (112 kg)     Health Maintenance Due  Topic Date Due  . Hepatitis C Screening  Never done  . HIV Screening  Never done  . COVID-19 Vaccine (1) Never done  . TETANUS/TDAP  Never done  . COLONOSCOPY  Never done    There are no preventive care reminders to display for this patient.  No results found for: TSH Lab Results  Component Value Date   WBC 7.6 06/02/2019   HGB 15.2 06/02/2019   HCT 45.7 06/02/2019   MCV 85 06/02/2019   PLT 317 06/02/2019   Lab Results  Component Value Date   NA 140 06/02/2019   K 4.4 06/02/2019   CO2 28 06/02/2019   GLUCOSE 97 06/02/2019   BUN 11 06/02/2019   CREATININE 0.81 06/02/2019   BILITOT 0.5 06/02/2019   ALKPHOS 83 06/02/2019   AST 22 06/02/2019   ALT 30 06/02/2019   PROT 7.0 06/02/2019   ALBUMIN 4.6 06/02/2019   CALCIUM 9.4 06/02/2019   Lab Results  Component Value Date   CHOL 160  06/02/2019   Lab Results  Component Value Date   HDL 38 (L) 06/02/2019   Lab Results  Component Value Date   LDLCALC 96 06/02/2019   Lab Results  Component Value Date   TRIG 150 (H) 06/02/2019   Lab Results  Component Value Date   CHOLHDL 4.2 06/02/2019   No results found for: HGBA1C    Assessment &  Plan:   Problem List Items Addressed This Visit      Cardiovascular and Mediastinum   Essential hypertension    An individual hypertension care plan was established and reinforced today.  The patient's status was assessed using clinical findings on exam and labs or diagnostic tests. The patient's success at meeting treatment goals on disease specific evidence-based guidelines and found to be well controlled. SELF MANAGEMENT: The patient and I together assessed ways to personally work towards obtaining the recommended goals. RECOMMENDATIONS: avoid decongestants found in common cold remedies, decrease consumption of alcohol, perform routine monitoring of BP with home BP cuff, exercise, reduction of dietary salt, take medicines as prescribed, try not to miss doses and quit smoking.  Regular exercise and maintaining a healthy weight is needed.  Stress reduction may help. A CLINICAL SUMMARY including written plan identify barriers to care unique to individual due to social or financial issues.  We attempt to mutually creat solutions for individual and family understanding.         No orders of the defined types were placed in this encounter.   Follow-up: Return in about 4 months (around 10/23/2019) for fasting.    Brent Bulla, MD

## 2019-06-25 NOTE — Assessment & Plan Note (Signed)

## 2019-07-04 ENCOUNTER — Telehealth (HOSPITAL_COMMUNITY): Payer: Self-pay | Admitting: *Deleted

## 2019-07-04 NOTE — Telephone Encounter (Signed)
Patient given detailed instructions per Myocardial Perfusion Study Information Sheet for the test on 07/11/19 at 11:15. Patient notified to arrive 15 minutes early and that it is imperative to arrive on time for appointment to keep from having the test rescheduled.  If you need to cancel or reschedule your appointment, please call the office within 24 hours of your appointment. . Patient verbalized understanding.Alexander Hicks

## 2019-07-10 ENCOUNTER — Encounter: Payer: Self-pay | Admitting: Cardiology

## 2019-07-10 ENCOUNTER — Ambulatory Visit (INDEPENDENT_AMBULATORY_CARE_PROVIDER_SITE_OTHER): Payer: 59 | Admitting: Cardiology

## 2019-07-10 ENCOUNTER — Other Ambulatory Visit: Payer: Self-pay

## 2019-07-10 VITALS — BP 150/100 | HR 78 | Ht 69.0 in | Wt 247.0 lb

## 2019-07-10 DIAGNOSIS — I10 Essential (primary) hypertension: Secondary | ICD-10-CM

## 2019-07-10 DIAGNOSIS — E781 Pure hyperglyceridemia: Secondary | ICD-10-CM

## 2019-07-10 DIAGNOSIS — E8881 Metabolic syndrome: Secondary | ICD-10-CM

## 2019-07-10 DIAGNOSIS — E669 Obesity, unspecified: Secondary | ICD-10-CM

## 2019-07-10 DIAGNOSIS — R072 Precordial pain: Secondary | ICD-10-CM

## 2019-07-10 DIAGNOSIS — R0602 Shortness of breath: Secondary | ICD-10-CM | POA: Diagnosis not present

## 2019-07-10 HISTORY — DX: Metabolic syndrome: E88.81

## 2019-07-10 HISTORY — DX: Metabolic syndrome: E88.810

## 2019-07-10 HISTORY — DX: Precordial pain: R07.2

## 2019-07-10 HISTORY — DX: Obesity, unspecified: E66.9

## 2019-07-10 HISTORY — DX: Pure hyperglyceridemia: E78.1

## 2019-07-10 MED ORDER — AMLODIPINE BESYLATE 5 MG PO TABS
5.0000 mg | ORAL_TABLET | Freq: Every day | ORAL | 3 refills | Status: DC
Start: 2019-07-10 — End: 2019-08-04

## 2019-07-10 NOTE — Patient Instructions (Signed)
Medication Instructions:  Your physician has recommended you make the following change in your medication:  START: Amlodipine 5 mg take one tablet by mouth daily.  *If you need a refill on your cardiac medications before your next appointment, please call your pharmacy*   Lab Work: Your physician recommends that you return for lab work in: TODAY Hgb A1C If you have labs (blood work) drawn today and your tests are completely normal, you will receive your results only by: Marland Kitchen MyChart Message (if you have MyChart) OR . A paper copy in the mail If you have any lab test that is abnormal or we need to change your treatment, we will call you to review the results.   Testing/Procedures: None   Follow-Up: At [facility], you and your health needs are our priority.  As part of our continuing mission to provide you with exceptional heart care, we have created designated Provider Care Teams.  These Care Teams include your primary Cardiologist (physician) and Advanced Practice Providers (APPs -  Physician Assistants and Nurse Practitioners) who all work together to provide you with the care you need, when you need it.  We recommend signing up for the patient portal called "MyChart".  Sign up information is provided on this After Visit Summary.  MyChart is used to connect with patients for Virtual Visits (Telemedicine).  Patients are able to view lab/test results, encounter notes, upcoming appointments, etc.  Non-urgent messages can be sent to your provider as well.   To learn more about what you can do with MyChart, go to [email or web address].    Your next appointment:   3 month(s)  The format for your next appointment:   In Person  Provider:   Dr. Servando Salina   Other Instructions

## 2019-07-10 NOTE — Progress Notes (Signed)
Cardiology Office Note:    Date:  07/10/2019   ID:  Alexander Hicks, DOB 02/20/1966, MRN [id number]  PCP:  Abigail Miyamoto, MD  Cardiologist:  Thomasene Ripple, DO  Electrophysiologist:  None   Referring MD: Abigail Miyamoto,*   The patient is here for a follow up for hypertension.  History of Present Illness:    Alexander Hicks is a 54 y.o. male with a hx of hypertension, history of brain aneurysm, anxiety disorder.  I last saw the patient on June 16, 2019 at that time he reported that he had been experiencing intermittent substernal chest pain.  He also was hypertensive but he had just started back on his medication therefore I did not add any additional medication to avoid hypotension.  He is here today for follow-up visit his blood pressure.  He tells me that the chest pain has gotten gradually worse.  And starting to progress at rest.  He describes it as a midsternal chest pain which radiates to his left shoulder along with shortness of breath.  He has not tried the nitroglycerin because he is worried that once he started taking nitroglycerin he will be on it for life.  But he tells me that he feels miserable.  He does not have any chest pain in the office today.  He did see his PCP on June 23, 2019, no changes was made in his medication.  I did review the PCP notes independently.  Past Medical History:  Diagnosis Date  . Brain aneurysm   . Essential hypertension   . Gastro-esophageal reflux disease without esophagitis   . Generalized anxiety disorder   . GERD (gastroesophageal reflux disease)   . Nontoxic single thyroid nodule     Past Surgical History:  Procedure Laterality Date  . VASECTOMY      Current Medications: Current Meds  Medication Sig  . ALPRAZolam (XANAX) 0.25 MG tablet Take 1 tablet (0.25 mg total) by mouth 2 (two) times daily as needed for anxiety.  . APPLE CIDER VINEGAR PO Take by mouth daily. Goli gummies  . aspirin EC 81 MG tablet Take 81  mg by mouth daily.  . citalopram (CELEXA) 20 MG tablet Take 10 mg by mouth daily.  Marland Kitchen lisinopril (ZESTRIL) 20 MG tablet Take 1 tablet (20 mg total) by mouth daily.  . meclizine (ANTIVERT) 25 MG tablet Take 1 tablet (25 mg total) by mouth 3 (three) times daily as needed for dizziness.  . Multiple Vitamins-Minerals (EMERGEN-C IMMUNE PO) Take by mouth daily.  . nitroGLYCERIN (NITROSTAT) 0.4 MG SL tablet Place 1 tablet (0.4 mg total) under the tongue every 5 (five) minutes as needed.     Allergies:   Hydromorphone, Penicillins, and Phenytoin sodium extended   Social History   Socioeconomic History  . Marital status: Married    Spouse name: Not on file  . Number of children: Not on file  . Years of education: Not on file  . Highest education level: Not on file  Occupational History  . Not on file  Tobacco Use  . Smoking status: Never Smoker  . Smokeless tobacco: Current User    Types: Snuff  Substance and Sexual Activity  . Alcohol use: Never    Comment: rarely  . Drug use: Never  . Sexual activity: Yes    Partners: Female  Other Topics Concern  . Not on file  Social History Narrative  . Not on file   Social Determinants of Health   Financial  Resource Strain:   . Difficulty of Paying Living Expenses:   Food Insecurity:   . Worried About Programme researcher, broadcasting/film/video in the Last Year:   . Barista in the Last Year:   Transportation Needs:   . Freight forwarder (Medical):   Marland Kitchen Lack of Transportation (Non-Medical):   Physical Activity:   . Days of Exercise per Week:   . Minutes of Exercise per Session:   Stress:   . Feeling of Stress :   Social Connections:   . Frequency of Communication with Friends and Family:   . Frequency of Social Gatherings with Friends and Family:   . Attends Religious Services:   . Active Member of Clubs or Organizations:   . Attends Banker Meetings:   Marland Kitchen Marital Status:      Family History: The patient's family history  includes Cancer in his father and sister; Hypertension in his brother, father, and mother; Pneumonia in his mother.  ROS:   Review of Systems  Constitution: Negative for decreased appetite, fever and weight gain.  HENT: Negative for congestion, ear discharge, hoarse voice and sore throat.   Eyes: Negative for discharge, redness, vision loss in right eye and visual halos.  Cardiovascular: Reports chest pain and shortness of breath.  Negative for leg swelling, orthopnea and palpitations.  Respiratory: Negative for cough, hemoptysis, shortness of breath and snoring.   Endocrine: Negative for heat intolerance and polyphagia.  Hematologic/Lymphatic: Negative for bleeding problem. Does not bruise/bleed easily.  Skin: Negative for flushing, nail changes, rash and suspicious lesions.  Musculoskeletal: Negative for arthritis, joint pain, muscle cramps, myalgias, neck pain and stiffness.  Gastrointestinal: Negative for abdominal pain, bowel incontinence, diarrhea and excessive appetite.  Genitourinary: Negative for decreased libido, genital sores and incomplete emptying.  Neurological: Negative for brief paralysis, focal weakness, headaches and loss of balance.  Psychiatric/Behavioral: Negative for altered mental status, depression and suicidal ideas.  Allergic/Immunologic: Negative for HIV exposure and persistent infections.    EKGs/Labs/Other Studies Reviewed:    The following studies were reviewed today:   EKG:  The ekg ordered today demonstrates sinus rhythm, heart rate 70 bpm with incomplete right bundle branch block compared to prior EKG no significant change.   Recent Labs: 06/02/2019: ALT 30; BUN 11; Creatinine, Ser 0.81; Hemoglobin 15.2; Platelets 317; Potassium 4.4; Sodium 140  Recent Lipid Panel    Component Value Date/Time   CHOL 160 06/02/2019 1126   TRIG 150 (H) 06/02/2019 1126   HDL 38 (L) 06/02/2019 1126   CHOLHDL 4.2 06/02/2019 1126   LDLCALC 96 06/02/2019 1126     Physical Exam:    VS:  BP (!) 150/100 (BP Location: Left Arm, Patient Position: Sitting, Cuff Size: Large)   Pulse 78   Ht 5\' 9"  (1.753 m)   Wt 247 lb (112 kg)   SpO2 95%   BMI 36.48 kg/m     Wt Readings from Last 3 Encounters:  07/10/19 247 lb (112 kg)  06/23/19 249 lb (112.9 kg)  06/16/19 249 lb (112.9 kg)     GEN: Well nourished, well developed in no acute distress HEENT: Normal NECK: No JVD; No carotid bruits LYMPHATICS: No lymphadenopathy CARDIAC: S1S2 noted,RRR, no murmurs, rubs, gallops RESPIRATORY:  Clear to auscultation without rales, wheezing or rhonchi  ABDOMEN: Soft, non-tender, non-distended, +bowel sounds, no guarding. EXTREMITIES: No edema, No cyanosis, no clubbing MUSCULOSKELETAL:  No deformity  SKIN: Warm and dry, with tattoos NEUROLOGIC:  Alert and oriented x 3,  non-focal PSYCHIATRIC:  Normal affect, good insight  ASSESSMENT:    1. Essential hypertension   2. Metabolic syndrome   3. Precordial pain   4. Shortness of breath   5. Hypertriglyceridemia   6. Obesity (BMI 30-39.9)    PLAN:    He is hypertensive in the office today-therefore I am going to add amlodipine 5 mg daily to his lisinopril 20 mg daily.  I have also educated patient about decreasing salt intake.  He still is having chest pain, thankfully his EKG in the office today show no acute changes.  He is scheduled for his stress test tomorrow which he plans to undergo.  Of also asked the patient to use the nitrate as needed I educated him that using his as needed nitroglycerin does not mean that he could become nitro for his lifetime.  It also help me understand if he gets some relief.  In addition I have advised him if the pain recurs and get worse to go to the emergency department.  Shortness of breath on exertion-this could be multifactorial which could include deconditioning as well as an anginal equivalent.  He has a stress test as above.  In addition an echocardiogram has also been  ordered to assess LV function and structural abnormalities.  Metabolic syndrome-with his hypertriglyceridemia like to get a hemoglobin A1c to rule out any possibility of diabetes.  I also educated patient on type of diet to decrease in order to help his increased triglyceride.  Obesity-the patient understands the need to lose weight with diet and exercise. We have discussed specific strategies for this.  Once the patient get his testing done all discussed further recommendations if needed with him.  The patient is in agreement with the above plan. The patient left the office in stable condition.  The patient will follow up in 3 months or sooner if needed   Medication Adjustments/Labs and Tests Ordered: Current medicines are reviewed at length with the patient today.  Concerns regarding medicines are outlined above.  Orders Placed This Encounter  Procedures  . HgB A1c  . EKG 12-Lead   Meds ordered this encounter  Medications  . amLODipine (NORVASC) 5 MG tablet    Sig: Take 1 tablet (5 mg total) by mouth daily.    Dispense:  90 tablet    Refill:  3    Patient Instructions  Medication Instructions:  Your physician has recommended you make the following change in your medication:  START: Amlodipine 5 mg take one tablet by mouth daily.  *If you need a refill on your cardiac medications before your next appointment, please call your pharmacy*   Lab Work: Your physician recommends that you return for lab work in: TODAY Hgb A1C If you have labs (blood work) drawn today and your tests are completely normal, you will receive your results only by: Marland Kitchen MyChart Message (if you have MyChart) OR . A paper copy in the mail If you have any lab test that is abnormal or we need to change your treatment, we will call you to review the results.   Testing/Procedures: None   Follow-Up: At [facility], you and your health needs are our priority.  As part of our continuing mission to provide  you with exceptional heart care, we have created designated Provider Care Teams.  These Care Teams include your primary Cardiologist (physician) and Advanced Practice Providers (APPs -  Physician Assistants and Nurse Practitioners) who all work together to provide you with the care  you need, when you need it.  We recommend signing up for the patient portal called "MyChart".  Sign up information is provided on this After Visit Summary.  MyChart is used to connect with patients for Virtual Visits (Telemedicine).  Patients are able to view lab/test results, encounter notes, upcoming appointments, etc.  Non-urgent messages can be sent to your provider as well.   To learn more about what you can do with MyChart, go to [email or web address].    Your next appointment:   3 month(s)  The format for your next appointment:   In Person  Provider:   Dr. Servando Salina   Other Instructions      Adopting a Healthy Lifestyle.  Know what a healthy weight is for you (roughly BMI <25) and aim to maintain this   Aim for 7+ servings of fruits and vegetables daily   65-80+ fluid ounces of water or unsweet tea for healthy kidneys   Limit to max 1 drink of alcohol per day; avoid smoking/tobacco   Limit animal fats in diet for cholesterol and heart health - choose grass fed whenever available   Avoid highly processed foods, and foods high in saturated/trans fats   Aim for low stress - take time to unwind and care for your mental health   Aim for 150 min of moderate intensity exercise weekly for heart health, and weights twice weekly for bone health   Aim for 7-9 hours of sleep daily   When it comes to diets, agreement about the perfect plan isnt easy to find, even among the experts. Experts at the [facility] [employer] developed an idea known as the Healthy Eating Plate. Just imagine a plate divided into logical, healthy portions.   The emphasis is on diet quality:   Load up on vegetables and  fruits - one-half of your plate: Aim for color and variety, and remember that potatoes dont count.   Go for whole grains - one-quarter of your plate: Whole wheat, barley, wheat berries, quinoa, oats, brown rice, and foods made with them. If you want pasta, go with whole wheat pasta.   Protein power - one-quarter of your plate: Fish, chicken, beans, and nuts are all healthy, versatile protein sources. Limit red meat.   The diet, however, does go beyond the plate, offering a few other suggestions.   Use healthy plant oils, such as olive, canola, soy, corn, sunflower and peanut. Check the labels, and avoid partially hydrogenated oil, which have unhealthy trans fats.   If youre thirsty, drink water. Coffee and tea are good in moderation, but skip sugary drinks and limit milk and dairy products to one or two daily servings.   The type of carbohydrate in the diet is more important than the amount. Some sources of carbohydrates, such as vegetables, fruits, whole grains, and beans-are healthier than others.   Finally, stay active  Signed, Thomasene Ripple, DO  07/10/2019 9:57 AM    [facility]

## 2019-07-11 ENCOUNTER — Ambulatory Visit (INDEPENDENT_AMBULATORY_CARE_PROVIDER_SITE_OTHER): Payer: 59

## 2019-07-11 ENCOUNTER — Telehealth: Payer: Self-pay

## 2019-07-11 DIAGNOSIS — R0600 Dyspnea, unspecified: Secondary | ICD-10-CM

## 2019-07-11 DIAGNOSIS — R079 Chest pain, unspecified: Secondary | ICD-10-CM | POA: Diagnosis not present

## 2019-07-11 LAB — HEMOGLOBIN A1C
Est. average glucose Bld gHb Est-mCnc: 117 mg/dL
Hgb A1c MFr Bld: 5.7 % — ABNORMAL HIGH (ref 4.8–5.6)

## 2019-07-11 NOTE — Telephone Encounter (Signed)
Spoke with patient regarding results and recommendation.  Patient verbalizes understanding and is agreeable to plan of care. Advised patient to call back with any issues or concerns.  

## 2019-07-11 NOTE — Telephone Encounter (Signed)
-----   Message from Thomasene Ripple, DO sent at 07/11/2019  8:40 AM EDT ----- Your hemoglobin A1c showed prediabetic 5.7.  I recommend that you watch your diet to lower significant carbohydrates, continue with exercise to help reduce your risk for developing diabetes.

## 2019-07-11 NOTE — Progress Notes (Signed)
Complete echocardiogram has been performed.  Jimmy Caley Ciaramitaro RDCS, RVT 

## 2019-07-12 ENCOUNTER — Telehealth: Payer: Self-pay | Admitting: *Deleted

## 2019-07-12 ENCOUNTER — Telehealth: Payer: Self-pay

## 2019-07-12 NOTE — Telephone Encounter (Signed)
-----   Message from Thomasene Ripple, DO sent at 07/12/2019  2:08 PM EDT ----- Your left ventricle wall is mildly thickened which is assisting with the hypertension.  But otherwise normal echocardiogram

## 2019-07-12 NOTE — Telephone Encounter (Signed)
Left message on voicemail per DPR in reference to upcoming appointment scheduled on 07/19/2019 at 0745 with detailed instructions given per Myocardial Perfusion Study Information Sheet for the test. LM to arrive 15 minutes early, and that it is imperative to arrive on time for appointment to keep from having the test rescheduled. If you need to cancel or reschedule your appointment, please call the office within 24 hours of your appointment. Failure to do so may result in a cancellation of your appointment, and a $50 no show fee. Phone number given for call back for any questions.   No mychart available.Rie Mcneil, Adelene Idler

## 2019-07-12 NOTE — Telephone Encounter (Signed)
Spoke with patients wife regarding results and recommendation. ° °She verbalizes understanding and is agreeable to plan of care. Advised patient to call back with any issues or concerns.  °

## 2019-07-19 ENCOUNTER — Other Ambulatory Visit: Payer: Self-pay

## 2019-07-19 ENCOUNTER — Telehealth: Payer: Self-pay

## 2019-07-19 ENCOUNTER — Ambulatory Visit (INDEPENDENT_AMBULATORY_CARE_PROVIDER_SITE_OTHER): Payer: 59

## 2019-07-19 ENCOUNTER — Ambulatory Visit (INDEPENDENT_AMBULATORY_CARE_PROVIDER_SITE_OTHER): Payer: 59 | Admitting: Physician Assistant

## 2019-07-19 ENCOUNTER — Encounter: Payer: Self-pay | Admitting: Physician Assistant

## 2019-07-19 VITALS — BP 128/78 | HR 75 | Temp 98.3°F | Resp 16 | Wt 248.0 lb

## 2019-07-19 DIAGNOSIS — R079 Chest pain, unspecified: Secondary | ICD-10-CM | POA: Diagnosis not present

## 2019-07-19 DIAGNOSIS — L247 Irritant contact dermatitis due to plants, except food: Secondary | ICD-10-CM

## 2019-07-19 HISTORY — DX: Irritant contact dermatitis due to plants, except food: L24.7

## 2019-07-19 LAB — MYOCARDIAL PERFUSION IMAGING
Peak HR: 74 {beats}/min
Rest HR: 61 {beats}/min

## 2019-07-19 MED ORDER — TECHNETIUM TC 99M TETROFOSMIN IV KIT
10.1000 | PACK | Freq: Once | INTRAVENOUS | Status: AC | PRN
  Administered 2019-07-19: 10.1 via INTRAVENOUS

## 2019-07-19 MED ORDER — TECHNETIUM TC 99M TETROFOSMIN IV KIT
32.1000 | PACK | Freq: Once | INTRAVENOUS | Status: AC | PRN
  Administered 2019-07-19: 32.1 via INTRAVENOUS

## 2019-07-19 MED ORDER — REGADENOSON 0.4 MG/5ML IV SOLN
0.4000 mg | Freq: Once | INTRAVENOUS | Status: AC
  Administered 2019-07-19: 0.4 mg via INTRAVENOUS

## 2019-07-19 MED ORDER — TRIAMCINOLONE ACETONIDE 40 MG/ML IJ SUSP
80.0000 mg | Freq: Once | INTRAMUSCULAR | Status: AC
  Administered 2019-07-19: 80 mg via INTRAMUSCULAR

## 2019-07-19 NOTE — Telephone Encounter (Signed)
Spoke with patient regarding results and recommendation.  Patient verbalizes understanding and is agreeable to plan of care. Advised patient to call back with any issues or concerns.  

## 2019-07-19 NOTE — Telephone Encounter (Signed)
-----   Message from Alver Sorrow, NP sent at 07/19/2019  1:01 PM EDT ----- Stress test was low risk with normal pumping function. Great result! Chest pain likely non-cardiac.

## 2019-07-19 NOTE — Assessment & Plan Note (Signed)
Kenalog 80mg  given IM May use claritin or zyrtec as needed

## 2019-07-19 NOTE — Progress Notes (Signed)
Acute Office Visit  Subjective:    Patient ID: Alexander Hicks, male    DOB: 1965/09/24, 54 y.o.   MRN: [id number]  Chief Complaint  Patient presents with  . Rash    HPI Patient is in today for poison ivy Pt states he has been putting up a fence and got into poison ivy - is broken out on arms, feet, and face including eyelids and tip of nose Denies trouble breathing/swallowing  Past Medical History:  Diagnosis Date  . Brain aneurysm   . Essential hypertension   . Gastro-esophageal reflux disease without esophagitis   . Generalized anxiety disorder   . GERD (gastroesophageal reflux disease)   . Nontoxic single thyroid nodule     Past Surgical History:  Procedure Laterality Date  . VASECTOMY      Family History  Problem Relation Age of Onset  . Hypertension Mother   . Pneumonia Mother   . Hypertension Father   . Cancer Father   . Cancer Sister   . Hypertension Brother     Social History   Socioeconomic History  . Marital status: Married    Spouse name: Not on file  . Number of children: Not on file  . Years of education: Not on file  . Highest education level: Not on file  Occupational History  . Not on file  Tobacco Use  . Smoking status: Never Smoker  . Smokeless tobacco: Current User    Types: Snuff  Substance and Sexual Activity  . Alcohol use: Never    Comment: rarely  . Drug use: Never  . Sexual activity: Yes    Partners: Female  Other Topics Concern  . Not on file  Social History Narrative  . Not on file   Social Determinants of Health   Financial Resource Strain:   . Difficulty of Paying Living Expenses:   Food Insecurity:   . Worried About Charity fundraiser in the Last Year:   . Arboriculturist in the Last Year:   Transportation Needs:   . Film/video editor (Medical):   Marland Kitchen Lack of Transportation (Non-Medical):   Physical Activity:   . Days of Exercise per Week:   . Minutes of Exercise per Session:   Stress:   . Feeling  of Stress :   Social Connections:   . Frequency of Communication with Friends and Family:   . Frequency of Social Gatherings with Friends and Family:   . Attends Religious Services:   . Active Member of Clubs or Organizations:   . Attends Archivist Meetings:   Marland Kitchen Marital Status:   Intimate Partner Violence:   . Fear of Current or Ex-Partner:   . Emotionally Abused:   Marland Kitchen Physically Abused:   . Sexually Abused:      Current Outpatient Medications:  .  citalopram (CELEXA) 10 MG tablet, Take 10 mg by mouth daily., Disp: , Rfl:  .  ALPRAZolam (XANAX) 0.25 MG tablet, Take 1 tablet (0.25 mg total) by mouth 2 (two) times daily as needed for anxiety., Disp: 30 tablet, Rfl: 2 .  amLODipine (NORVASC) 5 MG tablet, Take 1 tablet (5 mg total) by mouth daily., Disp: 90 tablet, Rfl: 3 .  APPLE CIDER VINEGAR PO, Take by mouth daily. Goli gummies, Disp: , Rfl:  .  aspirin EC 81 MG tablet, Take 81 mg by mouth daily., Disp: , Rfl:  .  lisinopril (ZESTRIL) 20 MG tablet, Take 1 tablet (20 mg total)  by mouth daily., Disp: 30 tablet, Rfl: 6 .  meclizine (ANTIVERT) 25 MG tablet, Take 1 tablet (25 mg total) by mouth 3 (three) times daily as needed for dizziness., Disp: 30 tablet, Rfl: 2 .  Multiple Vitamins-Minerals (EMERGEN-C IMMUNE PO), Take by mouth daily., Disp: , Rfl:  .  nitroGLYCERIN (NITROSTAT) 0.4 MG SL tablet, Place 1 tablet (0.4 mg total) under the tongue every 5 (five) minutes as needed., Disp: 25 tablet, Rfl: 3  Current Facility-Administered Medications:  .  triamcinolone acetonide (KENALOG-40) injection 80 mg, 80 mg, Intramuscular, Once, Marianne Sofia, PA-C   Allergies  Allergen Reactions  . Hydromorphone Anaphylaxis, Rash and Swelling    Swelling.    Marland Kitchen Penicillins Anaphylaxis and Swelling    Throat Hives as well.  Hives as well.    Marland Kitchen Phenytoin Sodium Extended Swelling    CONSTITUTIONAL: Negative for chills, fatigue, fever, unintentional weight gain and unintentional weight  loss.  E/N/T: Negative for ear pain, nasal congestion and sore throat.  CARDIOVASCULAR: Negative for chest pain, dizziness, palpitations and pedal edema.  RESPIRATORY: Negative for recent cough and dyspnea.   INTEGUMENTARY: see HPI         Objective:    PHYSICAL EXAM:   VS: BP 128/78   Pulse 75   Temp 98.3 F (36.8 C)   Resp 16   Wt 248 lb (112.5 kg)   SpO2 96%   BMI 36.62 kg/m   GEN: Well nourished, well developed, in no acute distress  HEENT: normal external ears and nose - normal external auditory canals and  Oropharynx - normal mucosa, palate, and posterior pharynx  Cardiac: RRR; no murmurs, rubs, or gallops,no edema - no significant varicosities Respiratory:  normal respiratory rate and pattern with no distress - normal breath sounds with no rales, rhonchi, wheezes or rubs Skin - blistering noted on upper arms, nose, face    Wt Readings from Last 3 Encounters:  07/19/19 248 lb (112.5 kg)  07/19/19 249 lb (112.9 kg)  07/10/19 247 lb (112 kg)    Health Maintenance Due  Topic Date Due  . Hepatitis C Screening  Never done  . HIV Screening  Never done  . COVID-19 Vaccine (1) Never done  . TETANUS/TDAP  Never done  . COLONOSCOPY  Never done    There are no preventive care reminders to display for this patient.        Assessment & Plan:   Problem List Items Addressed This Visit      Musculoskeletal and Integument   Contact dermatitis and eczema due to plant - Primary    Kenalog 80mg  given IM May use claritin or zyrtec as needed      Relevant Medications   triamcinolone acetonide (KENALOG-40) injection 80 mg (Start on 07/19/2019  2:15 PM)       Meds ordered this encounter  Medications  . triamcinolone acetonide (KENALOG-40) injection 80 mg     SARA R Daveyon Kitchings, PA-C

## 2019-07-24 ENCOUNTER — Telehealth: Payer: Self-pay | Admitting: Cardiology

## 2019-07-24 NOTE — Telephone Encounter (Signed)
  Wife is calling because she states that even though her husbands stress test came back good, she says he is still having the chest pain on exertion. He is also having cramps in his left leg. They would like to speak to the nurse about what they can do from here.

## 2019-07-24 NOTE — Telephone Encounter (Signed)
Yes please have him schedule sooner than July.

## 2019-07-24 NOTE — Telephone Encounter (Signed)
Spoke to the patients wife just now and we got the patient scheduled for 08/04/2019. She verbalizes understanding and no other issues or concerns were noted at this time.    Encouraged patient to call back with any questions or concerns.

## 2019-07-28 ENCOUNTER — Ambulatory Visit: Payer: 59 | Admitting: Legal Medicine

## 2019-08-04 ENCOUNTER — Other Ambulatory Visit: Payer: Self-pay

## 2019-08-04 ENCOUNTER — Encounter: Payer: Self-pay | Admitting: Cardiology

## 2019-08-04 ENCOUNTER — Encounter: Payer: Self-pay | Admitting: Legal Medicine

## 2019-08-04 ENCOUNTER — Ambulatory Visit (INDEPENDENT_AMBULATORY_CARE_PROVIDER_SITE_OTHER): Payer: 59 | Admitting: Legal Medicine

## 2019-08-04 ENCOUNTER — Ambulatory Visit (INDEPENDENT_AMBULATORY_CARE_PROVIDER_SITE_OTHER): Payer: 59 | Admitting: Cardiology

## 2019-08-04 VITALS — BP 130/88 | HR 89 | Temp 98.1°F | Resp 16 | Ht 69.0 in | Wt 246.0 lb

## 2019-08-04 VITALS — BP 134/78 | HR 74 | Ht 69.0 in | Wt 246.0 lb

## 2019-08-04 DIAGNOSIS — I10 Essential (primary) hypertension: Secondary | ICD-10-CM

## 2019-08-04 DIAGNOSIS — R079 Chest pain, unspecified: Secondary | ICD-10-CM | POA: Diagnosis not present

## 2019-08-04 DIAGNOSIS — I739 Peripheral vascular disease, unspecified: Secondary | ICD-10-CM | POA: Diagnosis not present

## 2019-08-04 MED ORDER — AMLODIPINE BESYLATE 10 MG PO TABS: 10.0000 mg | ORAL_TABLET | Freq: Every day | ORAL | 2 refills | Status: DC

## 2019-08-04 NOTE — Progress Notes (Signed)
Cardiology Office Note:    Date:  08/04/2019   ID:  ERICO STAN, DOB June 30, 1965, MRN [id number]  PCP:  Lillard Anes, MD  Cardiologist:  Berniece Salines, DO  Electrophysiologist:  None   Referring MD: Lillard Anes,*   " I am here for a follow up"  History of Present Illness:    DARSHAWN BOATENG is a 54 y.o. male with a hx of hypertension, history of brain aneurysm and anxiety disorder. The patient went through 2021 at that time I added amlodipine to his already taking lisinopril due to the high blood pressure. Prior to that visit he had scheduled his testing for echo and stress test.   In the interim he has his echo as well as a stress test. These results were called to the patient prior. He also saw Dr. Henrene Pastor who increase his Amlodipine from 5 mg to 10 mg.   He reports that the episodes of chest pain has decreased but he still is experiencing intermittent chest pain. He has not used nitroglycerin as he is nervous to do so. Also mentioned that he has had some leg cramping when he walks his PCP has ordered ABIs this has not been scheduled.    Past Medical History:  Diagnosis Date   Brain aneurysm    Chest pain 06/16/2019   Contact dermatitis and eczema due to plant 07/19/2019   DDD (degenerative disc disease), lumbar 07/14/2013   Dyspnea 06/16/2019   Episodic tension type headache 05/04/2019   Essential hypertension    Family history of premature CAD 06/16/2019   Gastro-esophageal reflux disease without esophagitis    Generalized anxiety disorder    GERD (gastroesophageal reflux disease)    Hypertriglyceridemia 07/10/2019   Low back pain 07/14/2013   Formatting of this note might be different from the original. IMO Problem List Replacer Jan. 2016   Lumbar facet arthropathy 7/78/2423   Metabolic syndrome 5/36/1443   Nontoxic single thyroid nodule    Obesity (BMI 30-39.9) 07/10/2019   Precordial pain 07/10/2019   Presbyopia 05/05/2013   Vitamin D  deficiency 04/15/2018    Past Surgical History:  Procedure Laterality Date   VASECTOMY      Current Medications: Current Meds  Medication Sig   ALPRAZolam (XANAX) 0.25 MG tablet Take 1 tablet (0.25 mg total) by mouth 2 (two) times daily as needed for anxiety.   amLODipine (NORVASC) 10 MG tablet Take 1 tablet (10 mg total) by mouth daily.   APPLE CIDER VINEGAR PO Take by mouth daily. Goli gummies   aspirin EC 81 MG tablet Take 81 mg by mouth daily.   Cholecalciferol (VITAMIN D3) 10 MCG (400 UNIT) CAPS    citalopram (CELEXA) 10 MG tablet Take 10 mg by mouth daily.   Garlic 10 MG CAPS    lisinopril (ZESTRIL) 20 MG tablet Take 1 tablet (20 mg total) by mouth daily.   meclizine (ANTIVERT) 25 MG tablet Take 1 tablet (25 mg total) by mouth 3 (three) times daily as needed for dizziness.   Multiple Vitamins-Minerals (EMERGEN-C IMMUNE PO) Take by mouth daily.   nitroGLYCERIN (NITROSTAT) 0.4 MG SL tablet Place 1 tablet (0.4 mg total) under the tongue every 5 (five) minutes as needed.   Potassium (POTASSIMIN) 75 MG TABS      Allergies:   Amoxil [amoxicillin], Hydromorphone, Penicillins, and Phenytoin sodium extended   Social History   Socioeconomic History   Marital status: Married    Spouse name: Not on file   Number  of children: Not on file   Years of education: Not on file   Highest education level: Not on file  Occupational History   Not on file  Tobacco Use   Smoking status: Never Smoker   Smokeless tobacco: Current User    Types: Snuff  Substance and Sexual Activity   Alcohol use: Never    Comment: rarely   Drug use: Never   Sexual activity: Yes    Partners: Female  Other Topics Concern   Not on file  Social History Narrative   Not on file   Social Determinants of Health   Financial Resource Strain:    Difficulty of Paying Living Expenses:   Food Insecurity:    Worried About Programme researcher, broadcasting/film/video in the Last Year:    Barista in the  Last Year:   Transportation Needs:    Freight forwarder (Medical):    Lack of Transportation (Non-Medical):   Physical Activity:    Days of Exercise per Week:    Minutes of Exercise per Session:   Stress:    Feeling of Stress :   Social Connections:    Frequency of Communication with Friends and Family:    Frequency of Social Gatherings with Friends and Family:    Attends Religious Services:    Active Member of Clubs or Organizations:    Attends Banker Meetings:    Marital Status:      Family History: The patient's family history includes Cancer in his father and sister; Hypertension in his brother, father, and mother; Pneumonia in his mother.  ROS:   Review of Systems  Constitution: Negative for decreased appetite, fever and weight gain.  HENT: Negative for congestion, ear discharge, hoarse voice and sore throat.   Eyes: Negative for discharge, redness, vision loss in right eye and visual halos.  Cardiovascular: Negative for chest pain, dyspnea on exertion, leg swelling, orthopnea and palpitations.  Respiratory: Negative for cough, hemoptysis, shortness of breath and snoring.   Endocrine: Negative for heat intolerance and polyphagia.  Hematologic/Lymphatic: Negative for bleeding problem. Does not bruise/bleed easily.  Skin: Negative for flushing, nail changes, rash and suspicious lesions.  Musculoskeletal: Negative for arthritis, joint pain, muscle cramps, myalgias, neck pain and stiffness.  Gastrointestinal: Negative for abdominal pain, bowel incontinence, diarrhea and excessive appetite.  Genitourinary: Negative for decreased libido, genital sores and incomplete emptying.  Neurological: Negative for brief paralysis, focal weakness, headaches and loss of balance.  Psychiatric/Behavioral: Negative for altered mental status, depression and suicidal ideas.  Allergic/Immunologic: Negative for HIV exposure and persistent infections.    EKGs/Labs/Other  Studies Reviewed:    The following studies were reviewed today:   EKG:  None today  TTE IMPRESSIONS  1. Left ventricular ejection fraction, by estimation, is 60 to 65%. The left ventricle has normal function. The left ventricle has no regional wall motion abnormalities. There is mild concentric left ventricular  hypertrophy. Left ventricular diastolic parameters were normal.  2. Right ventricular systolic function is normal. The right ventricular size is normal. There is normal pulmonary artery systolic pressure.  3. The mitral valve is normal in structure. Trivial mitral valve regurgitation. No evidence of mitral stenosis.  4. The aortic valve is normal in structure. Aortic valve regurgitation is not visualized. No aortic stenosis is present.  5. The inferior vena cava is normal in size with greater than 50% respiratory variability, suggesting right atrial pressure of 3 mmHg.   Pharmacologic nuclear stress test  There  was no ST segment deviation noted during stress.  The study is normal.  This is a low risk study.  The left ventricular ejection fraction is normal (55-65%).  Recent Labs: 06/02/2019: ALT 30; BUN 11; Creatinine, Ser 0.81; Hemoglobin 15.2; Platelets 317; Potassium 4.4; Sodium 140  Recent Lipid Panel    Component Value Date/Time   CHOL 160 06/02/2019 1126   TRIG 150 (H) 06/02/2019 1126   HDL 38 (L) 06/02/2019 1126   CHOLHDL 4.2 06/02/2019 1126   LDLCALC 96 06/02/2019 1126    Physical Exam:    VS:  BP 134/78    Pulse 74    Ht 5\' 9"  (1.753 m)    Wt 246 lb (111.6 kg)    SpO2 95%    BMI 36.33 kg/m     Wt Readings from Last 3 Encounters:  08/04/19 246 lb (111.6 kg)  08/04/19 246 lb (111.6 kg)  07/19/19 248 lb (112.5 kg)     GEN: Well nourished, well developed in no acute distress HEENT: Normal NECK: No JVD; No carotid bruits LYMPHATICS: No lymphadenopathy CARDIAC: S1S2 noted,RRR, no murmurs, rubs, gallops RESPIRATORY:  Clear to auscultation without  rales, wheezing or rhonchi  ABDOMEN: Soft, non-tender, non-distended, +bowel sounds, no guarding. EXTREMITIES: No edema, No cyanosis, no clubbing MUSCULOSKELETAL:  No deformity  SKIN: Warm and dry NEUROLOGIC:  Alert and oriented x 3, non-focal PSYCHIATRIC:  Normal affect, good insight  ASSESSMENT:    1. Essential hypertension   2. Chest pain, unspecified type   3. Bilateral leg claudication (HCC)    PLAN:     1. His blood pressure has improved significantly. Therefore we will continue continue patient amlodipine 10 mg daily as well as lisinopril 20 mg daily.  2. His chest pain his stress test is normal but he tells me that he still does have some need to be repeated. Of asked patient to continue to monitor this have educated him on using his Nitroglycerin will reassess in 3 months.  3. Sounds like leg claudication with PCP had ordered ABIs which have him agree with order to have the patient schedule this testing in office.  4. Lab work will be done for BMP today to assess electrolytes and kidney function  The patient is in agreement with the above plan. The patient left the office in stable condition.  The patient will follow up in 3 months or sooner.   Medication Adjustments/Labs and Tests Ordered: Current medicines are reviewed at length with the patient today.  Concerns regarding medicines are outlined above.  Orders Placed This Encounter  Procedures   Basic Metabolic Panel (BMET)   Magnesium   No orders of the defined types were placed in this encounter.   Patient Instructions  Medication Instructions:  Your physician recommends that you continue on your current medications as directed. Please refer to the Current Medication list given to you today.  *If you need a refill on your cardiac medications before your next appointment, please call your pharmacy*   Lab Work: Your physician recommends that you return for lab work in: TODAY BMP, Mag If you have labs (blood  work) drawn today and your tests are completely normal, you will receive your results only by:  MyChart Message (if you have MyChart) OR  A paper copy in the mail If you have any lab test that is abnormal or we need to change your treatment, we will call you to review the results.   Testing/Procedures: None   Follow-Up: At  [facility], you and your health needs are our priority.  As part of our continuing mission to provide you with exceptional heart care, we have created designated Provider Care Teams.  These Care Teams include your primary Cardiologist (physician) and Advanced Practice Providers (APPs -  Physician Assistants and Nurse Practitioners) who all work together to provide you with the care you need, when you need it.  We recommend signing up for the patient portal called "MyChart".  Sign up information is provided on this After Visit Summary.  MyChart is used to connect with patients for Virtual Visits (Telemedicine).  Patients are able to view lab/test results, encounter notes, upcoming appointments, etc.  Non-urgent messages can be sent to your provider as well.   To learn more about what you can do with MyChart, go to [email or web address].    Your next appointment:   3 month(s)  The format for your next appointment:   In Person  Provider:   Thomasene Ripple, DO   Other Instructions      Adopting a Healthy Lifestyle.  Know what a healthy weight is for you (roughly BMI <25) and aim to maintain this   Aim for 7+ servings of fruits and vegetables daily   65-80+ fluid ounces of water or unsweet tea for healthy kidneys   Limit to max 1 drink of alcohol per day; avoid smoking/tobacco   Limit animal fats in diet for cholesterol and heart health - choose grass fed whenever available   Avoid highly processed foods, and foods high in saturated/trans fats   Aim for low stress - take time to unwind and care for your mental health   Aim for 150 min of moderate  intensity exercise weekly for heart health, and weights twice weekly for bone health   Aim for 7-9 hours of sleep daily   When it comes to diets, agreement about the perfect plan isnt easy to find, even among the experts. Experts at the [facility] [employer] developed an idea known as the Healthy Eating Plate. Just imagine a plate divided into logical, healthy portions.   The emphasis is on diet quality:   Load up on vegetables and fruits - one-half of your plate: Aim for color and variety, and remember that potatoes dont count.   Go for whole grains - one-quarter of your plate: Whole wheat, barley, wheat berries, quinoa, oats, brown rice, and foods made with them. If you want pasta, go with whole wheat pasta.   Protein power - one-quarter of your plate: Fish, chicken, beans, and nuts are all healthy, versatile protein sources. Limit red meat.   The diet, however, does go beyond the plate, offering a few other suggestions.   Use healthy plant oils, such as olive, canola, soy, corn, sunflower and peanut. Check the labels, and avoid partially hydrogenated oil, which have unhealthy trans fats.   If youre thirsty, drink water. Coffee and tea are good in moderation, but skip sugary drinks and limit milk and dairy products to one or two daily servings.   The type of carbohydrate in the diet is more important than the amount. Some sources of carbohydrates, such as vegetables, fruits, whole grains, and beans-are healthier than others.   Finally, stay active  Signed, Thomasene Ripple, DO  08/04/2019 1:52 PM    [facility]

## 2019-08-04 NOTE — Progress Notes (Signed)
Established Patient Office Visit  Subjective:  Patient ID: Alexander Hicks, male    DOB: 12/16/1965  Age: 54 y.o. MRN: [id number]  CC:  Chief Complaint  Patient presents with  . Hypertension    HPI Alexander Hicks presents for hypertension.  He saw cardiology.  He is on amlodipine 5mg , lisinopril.  He has not taken his NTG despite Chest pain.  He is to see cardiology later today.  Past Medical History:  Diagnosis Date  . Brain aneurysm   . Chest pain 06/16/2019  . Contact dermatitis and eczema due to plant 07/19/2019  . DDD (degenerative disc disease), lumbar 07/14/2013  . Dyspnea 06/16/2019  . Episodic tension type headache 05/04/2019  . Essential hypertension   . Family history of premature CAD 06/16/2019  . Gastro-esophageal reflux disease without esophagitis   . Generalized anxiety disorder   . GERD (gastroesophageal reflux disease)   . Hypertriglyceridemia 07/10/2019  . Low back pain 07/14/2013   Formatting of this note might be different from the original. IMO Problem List Replacer Jan. 2016  . Lumbar facet arthropathy 08/25/2013  . Metabolic syndrome 2/97/9892  . Nontoxic single thyroid nodule   . Obesity (BMI 30-39.9) 07/10/2019  . Precordial pain 07/10/2019  . Presbyopia 05/05/2013  . Vitamin D deficiency 04/15/2018    Past Surgical History:  Procedure Laterality Date  . VASECTOMY      Family History  Problem Relation Age of Onset  . Hypertension Mother   . Pneumonia Mother   . Hypertension Father   . Cancer Father   . Cancer Sister   . Hypertension Brother     Social History   Socioeconomic History  . Marital status: Married    Spouse name: Not on file  . Number of children: Not on file  . Years of education: Not on file  . Highest education level: Not on file  Occupational History  . Not on file  Tobacco Use  . Smoking status: Never Smoker  . Smokeless tobacco: Current User    Types: Snuff  Substance and Sexual Activity  . Alcohol use: Never   Comment: rarely  . Drug use: Never  . Sexual activity: Yes    Partners: Female  Other Topics Concern  . Not on file  Social History Narrative  . Not on file   Social Determinants of Health   Financial Resource Strain:   . Difficulty of Paying Living Expenses:   Food Insecurity:   . Worried About Charity fundraiser in the Last Year:   . Arboriculturist in the Last Year:   Transportation Needs:   . Film/video editor (Medical):   Marland Kitchen Lack of Transportation (Non-Medical):   Physical Activity:   . Days of Exercise per Week:   . Minutes of Exercise per Session:   Stress:   . Feeling of Stress :   Social Connections:   . Frequency of Communication with Friends and Family:   . Frequency of Social Gatherings with Friends and Family:   . Attends Religious Services:   . Active Member of Clubs or Organizations:   . Attends Archivist Meetings:   Marland Kitchen Marital Status:   Intimate Partner Violence:   . Fear of Current or Ex-Partner:   . Emotionally Abused:   Marland Kitchen Physically Abused:   . Sexually Abused:     Outpatient Medications Prior to Visit  Medication Sig Dispense Refill  . ALPRAZolam (XANAX) 0.25 MG tablet Take 1 tablet (  0.25 mg total) by mouth 2 (two) times daily as needed for anxiety. 30 tablet 2  . APPLE CIDER VINEGAR PO Take by mouth daily. Goli gummies    . aspirin EC 81 MG tablet Take 81 mg by mouth daily.    . Cholecalciferol (VITAMIN D3) 10 MCG (400 UNIT) CAPS     . citalopram (CELEXA) 10 MG tablet Take 10 mg by mouth daily.    . Garlic 10 MG CAPS     . lisinopril (ZESTRIL) 20 MG tablet Take 1 tablet (20 mg total) by mouth daily. 30 tablet 6  . meclizine (ANTIVERT) 25 MG tablet Take 1 tablet (25 mg total) by mouth 3 (three) times daily as needed for dizziness. 30 tablet 2  . Multiple Vitamins-Minerals (EMERGEN-C IMMUNE PO) Take by mouth daily.    . nitroGLYCERIN (NITROSTAT) 0.4 MG SL tablet Place 1 tablet (0.4 mg total) under the tongue every 5 (five) minutes as  needed. 25 tablet 3  . Potassium (POTASSIMIN) 75 MG TABS     . amLODipine (NORVASC) 5 MG tablet Take 1 tablet (5 mg total) by mouth daily. 90 tablet 3   No facility-administered medications prior to visit.    Allergies  Allergen Reactions  . Amoxil [Amoxicillin] Shortness Of Breath  . Hydromorphone Anaphylaxis, Rash and Swelling    Swelling.    Marland Kitchen Penicillins Anaphylaxis and Swelling    Throat Hives as well.  Hives as well.    Marland Kitchen Phenytoin Sodium Extended Swelling    ROS Review of Systems  Constitutional: Negative.   HENT: Negative.   Eyes: Negative.   Respiratory: Positive for shortness of breath.   Cardiovascular: Positive for chest pain.  Gastrointestinal: Negative.   Endocrine: Negative.   Genitourinary: Negative.   Musculoskeletal: Negative.   Skin: Negative.   Neurological: Negative.       Objective:    Physical Exam  Constitutional: He appears well-developed and well-nourished.  HENT:  Head: Normocephalic and atraumatic.  Right Ear: External ear normal.  Left Ear: External ear normal.  Nose: Nose normal.  Mouth/Throat: Oropharynx is clear and moist.  Cardiovascular: Normal rate, regular rhythm, normal heart sounds and intact distal pulses.  Pulses:      Dorsalis pedis pulses are 1+ on the right side and 1+ on the left side.       Posterior tibial pulses are 1+ on the right side and 1+ on the left side.  Pulmonary/Chest: Effort normal and breath sounds normal.  Abdominal: Soft. Bowel sounds are normal.  Vitals reviewed.   BP 130/88   Pulse 89   Temp 98.1 F (36.7 C)   Resp 16   Ht 5\' 9"  (1.753 m)   Wt 246 lb (111.6 kg)   SpO2 97%   BMI 36.33 kg/m  Wt Readings from Last 3 Encounters:  08/04/19 246 lb (111.6 kg)  08/04/19 246 lb (111.6 kg)  07/19/19 248 lb (112.5 kg)     Health Maintenance Due  Topic Date Due  . Hepatitis C Screening  Never done  . COVID-19 Vaccine (1) Never done  . HIV Screening  Never done  . TETANUS/TDAP  Never done    . COLONOSCOPY  Never done    There are no preventive care reminders to display for this patient.  No results found for: TSH Lab Results  Component Value Date   WBC 7.6 06/02/2019   HGB 15.2 06/02/2019   HCT 45.7 06/02/2019   MCV 85 06/02/2019   PLT 317 06/02/2019  Lab Results  Component Value Date   NA 140 06/02/2019   K 4.4 06/02/2019   CO2 28 06/02/2019   GLUCOSE 97 06/02/2019   BUN 11 06/02/2019   CREATININE 0.81 06/02/2019   BILITOT 0.5 06/02/2019   ALKPHOS 83 06/02/2019   AST 22 06/02/2019   ALT 30 06/02/2019   PROT 7.0 06/02/2019   ALBUMIN 4.6 06/02/2019   CALCIUM 9.4 06/02/2019   Lab Results  Component Value Date   CHOL 160 06/02/2019   Lab Results  Component Value Date   HDL 38 (L) 06/02/2019   Lab Results  Component Value Date   LDLCALC 96 06/02/2019   Lab Results  Component Value Date   TRIG 150 (H) 06/02/2019   Lab Results  Component Value Date   CHOLHDL 4.2 06/02/2019   Lab Results  Component Value Date   HGBA1C 5.7 (H) 07/10/2019      Assessment & Plan:   Hypertension: An individual hypertension care plan was established and reinforced today.  The patient's status was assessed using clinical findings on exam and labs or diagnostic tests. The patient's success at meeting treatment goals on disease specific evidence-based guidelines and found to be fair controlled. SELF MANAGEMENT: The patient and I together assessed ways to personally work towards obtaining the recommended goals. I increased amlodipine to 10 mg to lower diastolics.  He is to see cardology later todayu for possible Cath.  He still is having pain and not using his NTG. RECOMMENDATIONS: avoid decongestants found in common cold remedies, decrease consumption of alcohol, perform routine monitoring of BP with home BP cuff, exercise, reduction of dietary salt, take medicines as prescribed, try not to miss doses and quit smoking.  Regular exercise and maintaining a healthy weight is  needed.  Stress reduction may help. A CLINICAL SUMMARY including written plan identify barriers to care unique to individual due to social or financial issues.  We attempt to mutually creat solutions for individual and family understanding. Meds ordered this encounter  Medications  . amLODipine (NORVASC) 10 MG tablet    Sig: Take 1 tablet (10 mg total) by mouth daily.    Dispense:  90 tablet    Refill:  2    Follow-up: Return in about 3 months (around 11/04/2019) for fasting.    Brent Bulla, MD

## 2019-08-04 NOTE — Patient Instructions (Signed)
Medication Instructions:  Your physician recommends that you continue on your current medications as directed. Please refer to the Current Medication list given to you today.  *If you need a refill on your cardiac medications before your next appointment, please call your pharmacy*   Lab Work: Your physician recommends that you return for lab work in: TODAY BMP, Mag If you have labs (blood work) drawn today and your tests are completely normal, you will receive your results only by: . MyChart Message (if you have MyChart) OR . A paper copy in the mail If you have any lab test that is abnormal or we need to change your treatment, we will call you to review the results.   Testing/Procedures: None   Follow-Up: At [facility], you and your health needs are our priority.  As part of our continuing mission to provide you with exceptional heart care, we have created designated Provider Care Teams.  These Care Teams include your primary Cardiologist (physician) and Advanced Practice Providers (APPs -  Physician Assistants and Nurse Practitioners) who all work together to provide you with the care you need, when you need it.  We recommend signing up for the patient portal called "MyChart".  Sign up information is provided on this After Visit Summary.  MyChart is used to connect with patients for Virtual Visits (Telemedicine).  Patients are able to view lab/test results, encounter notes, upcoming appointments, etc.  Non-urgent messages can be sent to your provider as well.   To learn more about what you can do with MyChart, go to [email or web address].    Your next appointment:   3 month(s)  The format for your next appointment:   In Person  Provider:   Kardie Tobb, DO   Other Instructions   

## 2019-08-05 LAB — MAGNESIUM: Magnesium: 2 mg/dL (ref 1.6–2.3)

## 2019-08-05 LAB — BASIC METABOLIC PANEL
BUN/Creatinine Ratio: 18 (ref 9–20)
BUN: 16 mg/dL (ref 6–24)
CO2: 24 mmol/L (ref 20–29)
Calcium: 9.5 mg/dL (ref 8.7–10.2)
Chloride: 101 mmol/L (ref 96–106)
Creatinine, Ser: 0.87 mg/dL (ref 0.76–1.27)
GFR calc Af Amer: 112 mL/min/{1.73_m2} (ref >59)
GFR calc non Af Amer: 96 mL/min/{1.73_m2} (ref >59)
Glucose: 89 mg/dL (ref 65–99)
Potassium: 4 mmol/L (ref 3.5–5.2)
Sodium: 140 mmol/L (ref 134–144)

## 2019-08-28 ENCOUNTER — Other Ambulatory Visit: Payer: Self-pay

## 2019-08-28 ENCOUNTER — Ambulatory Visit (INDEPENDENT_AMBULATORY_CARE_PROVIDER_SITE_OTHER): Payer: 59

## 2019-08-28 DIAGNOSIS — I739 Peripheral vascular disease, unspecified: Secondary | ICD-10-CM | POA: Diagnosis not present

## 2019-08-28 NOTE — Progress Notes (Signed)
ABI exam has been performed.  Jimmy Neeraj Housand RDCS, RVT 

## 2019-08-29 ENCOUNTER — Other Ambulatory Visit: Payer: Self-pay | Admitting: Legal Medicine

## 2019-08-29 DIAGNOSIS — F411 Generalized anxiety disorder: Secondary | ICD-10-CM

## 2019-09-01 ENCOUNTER — Encounter: Payer: 59 | Admitting: Vascular Surgery

## 2019-09-15 ENCOUNTER — Ambulatory Visit (INDEPENDENT_AMBULATORY_CARE_PROVIDER_SITE_OTHER): Payer: 59 | Admitting: Legal Medicine

## 2019-09-15 ENCOUNTER — Encounter: Payer: Self-pay | Admitting: Legal Medicine

## 2019-09-15 ENCOUNTER — Other Ambulatory Visit: Payer: Self-pay

## 2019-09-15 VITALS — BP 132/84 | HR 71 | Temp 97.4°F | Resp 16 | Ht 69.0 in | Wt 253.0 lb

## 2019-09-15 DIAGNOSIS — R072 Precordial pain: Secondary | ICD-10-CM

## 2019-09-15 DIAGNOSIS — Z0000 Encounter for general adult medical examination without abnormal findings: Secondary | ICD-10-CM

## 2019-09-15 LAB — PULMONARY FUNCTION TEST
FEV1/FVC: 65.3 %
FEV1: 2.57 L
FVC: 3.28 L

## 2019-09-15 NOTE — Patient Instructions (Signed)
Preventive Care 40-56 Years Old, Male °Preventive care refers to lifestyle choices and visits with your health care provider that can promote health and wellness. This includes: °· A yearly physical exam. This is also called an annual well check. °· Regular dental and eye exams. °· Immunizations. °· Screening for certain conditions. °· Healthy lifestyle choices, such as eating a healthy diet, getting regular exercise, not using drugs or products that contain nicotine and tobacco, and limiting alcohol use. °What can I expect for my preventive care visit? °Physical exam °Your health care provider will check: °· Height and weight. These may be used to calculate body mass index (BMI), which is a measurement that tells if you are at a healthy weight. °· Heart rate and blood pressure. °· Your skin for abnormal spots. °Counseling °Your health care provider may ask you questions about: °· Alcohol, tobacco, and drug use. °· Emotional well-being. °· Home and relationship well-being. °· Sexual activity. °· Eating habits. °· Work and work environment. °What immunizations do I need? ° °Influenza (flu) vaccine °· This is recommended every year. °Tetanus, diphtheria, and pertussis (Tdap) vaccine °· You may need a Td booster every 10 years. °Varicella (chickenpox) vaccine °· You may need this vaccine if you have not already been vaccinated. °Zoster (shingles) vaccine °· You may need this after age 60. °Measles, mumps, and rubella (MMR) vaccine °· You may need at least one dose of MMR if you were born in 1957 or later. You may also need a second dose. °Pneumococcal conjugate (PCV13) vaccine °· You may need this if you have certain conditions and were not previously vaccinated. °Pneumococcal polysaccharide (PPSV23) vaccine °· You may need one or two doses if you smoke cigarettes or if you have certain conditions. °Meningococcal conjugate (MenACWY) vaccine °· You may need this if you have certain conditions. °Hepatitis A  vaccine °· You may need this if you have certain conditions or if you travel or work in places where you may be exposed to hepatitis A. °Hepatitis B vaccine °· You may need this if you have certain conditions or if you travel or work in places where you may be exposed to hepatitis B. °Haemophilus influenzae type b (Hib) vaccine °· You may need this if you have certain risk factors. °Human papillomavirus (HPV) vaccine °· If recommended by your health care provider, you may need three doses over 6 months. °You may receive vaccines as individual doses or as more than one vaccine together in one shot (combination vaccines). Talk with your health care provider about the risks and benefits of combination vaccines. °What tests do I need? °Blood tests °· Lipid and cholesterol levels. These may be checked every 5 years, or more frequently if you are over 50 years old. °· Hepatitis C test. °· Hepatitis B test. °Screening °· Lung cancer screening. You may have this screening every year starting at age 55 if you have a 30-pack-year history of smoking and currently smoke or have quit within the past 15 years. °· Prostate cancer screening. Recommendations will vary depending on your family history and other risks. °· Colorectal cancer screening. All adults should have this screening starting at age 50 and continuing until age 75. Your health care provider may recommend screening at age 45 if you are at increased risk. You will have tests every 1-10 years, depending on your results and the type of screening test. °· Diabetes screening. This is done by checking your blood sugar (glucose) after you have not eaten   for a while (fasting). You may have this done every 1-3 years.  Sexually transmitted disease (STD) testing. Follow these instructions at home: Eating and drinking  Eat a diet that includes fresh fruits and vegetables, whole grains, lean protein, and low-fat dairy products.  Take vitamin and mineral supplements as  recommended by your health care provider.  Do not drink alcohol if your health care provider tells you not to drink.  If you drink alcohol: ? Limit how much you have to 0-2 drinks a day. ? Be aware of how much alcohol is in your drink. In the [place] one drink equals one 12 oz bottle of beer (355 mL), one 5 oz glass of wine (148 mL), or one 1 oz glass of hard liquor (44 mL). Lifestyle  Take daily care of your teeth and gums.  Stay active. Exercise for at least 30 minutes on 5 or more days each week.  Do not use any products that contain nicotine or tobacco, such as cigarettes, e-cigarettes, and chewing tobacco. If you need help quitting, ask your health care provider.  If you are sexually active, practice safe sex. Use a condom or other form of protection to prevent STIs (sexually transmitted infections).  Talk with your health care provider about taking a low-dose aspirin every day starting at age 50. What's next?  Go to your health care provider once a year for a well check visit.  Ask your health care provider how often you should have your eyes and teeth checked.  Stay up to date on all vaccines. This information is not intended to replace advice given to you by your health care provider. Make sure you discuss any questions you have with your health care provider. Document Revised: 02/10/2018 Document Reviewed: 02/10/2018 Elsevier Patient Education  2020 Elsevier Inc[employer]  

## 2019-09-15 NOTE — Progress Notes (Signed)
Subjective:  Patient ID: Alexander Hicks, Alexander Hicks    DOB: 02/17/1966  Age: 54 y.o. MRN: [id number]  Chief Complaint  Patient presents with  . Annual Exam    HPI  Well Adult Physical: Patient here for a comprehensive physical exam.The patient reports problems - chest pain with exercion.  He had a negative stress echo with cardiology Do you take any herbs or supplements that were not prescribed by a doctor? no Are you taking calcium supplements? no Are you taking aspirin daily? yes  Encounter for general adult medical examination without abnormal findings  Physical ("At Risk" items are starred): Patient's last physical exam was 1 year ago .  Weight: Appropriate for height BMI 37 ;  Blood Pressure: Normal (BP less than 120/80) ;  Medical History: Patient history reviewed ; Family history reviewed ;  Allergies Reviewed: No change in current allergies ;  Medications Reviewed: Medications reviewed - no changes ;  Lipids: Normal lipid levels ;  Smoking: Life-long non-smoker ;  Physical Activity: Exercises at work ;  Alcohol/Drug Use: Is a non-drinker ; No illicit drug use ;  Patient is not afflicted from Stress Incontinence and Urge Incontinence  Safety: reviewed ; Patient wears a seat belt, has smoke detectors, has carbon monoxide detectors, practices appropriate gun safety, and wears sunscreen with extended sun exposure. Dental Care: biannual cleanings, brushes and flosses daily. Ophthalmology/Optometry: Annual visit.  Hearing loss: none Vision impairments: none Last PSA:    Office Visit from 06/02/2019 in [facility]  PHQ-2 Total Score 6         [facility]  Decreased Interest 3  Down, Depressed, Hopeless 3  PHQ - 2 Score 6         Social History   Socioeconomic History  . Marital status: Married    Spouse name: Not on file  . Number of children: Not on file  . Years of education: Not on file  . Highest education level: Not on file    Occupational History  . Not on file  Tobacco Use  . Smoking status: Never Smoker  . Smokeless tobacco: Current User    Types: Snuff  Vaping Use  . Vaping Use: Never used  Substance and Sexual Activity  . Alcohol use: Never    Comment: rarely  . Drug use: Never  . Sexual activity: Yes    Partners: Female  Other Topics Concern  . Not on file  Social History Narrative  . Not on file   Social Determinants of Health   Financial Resource Strain:   . Difficulty of Paying Living Expenses:   Food Insecurity:   . Worried About Programme researcher, broadcasting/film/videounning Out of Food in the Last Year:   . Baristaan Out of Food in the Last Year:   Transportation Needs:   . Freight forwarderLack of Transportation (Medical):   Marland Kitchen. Lack of Transportation (Non-Medical):   Physical Activity:   . Days of Exercise per Week:   . Minutes of Exercise per Session:   Stress:   . Feeling of Stress :   Social Connections:   . Frequency of Communication with Friends and Family:   . Frequency of Social Gatherings with Friends and Family:   . Attends Religious Services:   . Active Member of Clubs or Organizations:   . Attends BankerClub or Organization Meetings:   Marland Kitchen. Marital Status:    Past Medical History:  Diagnosis Date  . Brain aneurysm   . Chest pain 06/16/2019  .  Contact dermatitis and eczema due to plant 07/19/2019  . DDD (degenerative disc disease), lumbar 07/14/2013  . Dyspnea 06/16/2019  . Episodic tension type headache 05/04/2019  . Essential hypertension   . Family history of premature CAD 06/16/2019  . Gastro-esophageal reflux disease without esophagitis   . Generalized anxiety disorder   . GERD (gastroesophageal reflux disease)   . Hypertriglyceridemia 07/10/2019  . Low back pain 07/14/2013   Formatting of this note might be different from the original. IMO Problem List Replacer Jan. 2016  . Lumbar facet arthropathy 08/25/2013  . Metabolic syndrome 07/10/2019  . Nontoxic single thyroid nodule   . Obesity (BMI 30-39.9) 07/10/2019  . Precordial pain  07/10/2019  . Presbyopia 05/05/2013  . Vitamin D deficiency 04/15/2018   Past Surgical History:  Procedure Laterality Date  . VASECTOMY      Family History  Problem Relation Age of Onset  . Hypertension Mother   . Pneumonia Mother   . Hypertension Father   . Cancer Father   . Cancer Sister   . Hypertension Brother    Social History   Socioeconomic History  . Marital status: Married    Spouse name: Not on file  . Number of children: Not on file  . Years of education: Not on file  . Highest education level: Not on file  Occupational History  . Not on file  Tobacco Use  . Smoking status: Never Smoker  . Smokeless tobacco: Current User    Types: Snuff  Vaping Use  . Vaping Use: Never used  Substance and Sexual Activity  . Alcohol use: Never    Comment: rarely  . Drug use: Never  . Sexual activity: Yes    Partners: Female  Other Topics Concern  . Not on file  Social History Narrative  . Not on file   Social Determinants of Health   Financial Resource Strain:   . Difficulty of Paying Living Expenses:   Food Insecurity:   . Worried About Programme researcher, broadcasting/film/video in the Last Year:   . Barista in the Last Year:   Transportation Needs:   . Freight forwarder (Medical):   Marland Kitchen Lack of Transportation (Non-Medical):   Physical Activity:   . Days of Exercise per Week:   . Minutes of Exercise per Session:   Stress:   . Feeling of Stress :   Social Connections:   . Frequency of Communication with Friends and Family:   . Frequency of Social Gatherings with Friends and Family:   . Attends Religious Services:   . Active Member of Clubs or Organizations:   . Attends Banker Meetings:   Marland Kitchen Marital Status:    Review of Systems  Constitutional: Negative.   HENT: Negative.   Eyes: Negative.   Respiratory: Positive for cough.   Cardiovascular: Positive for chest pain.  Gastrointestinal: Negative.        External heorrhoids  Endocrine: Negative.     Genitourinary: Negative.   Musculoskeletal: Negative.   Skin: Negative.   Neurological: Negative.   Psychiatric/Behavioral: Negative.      Objective:  BP 132/84   Pulse 71   Temp (!) 97.4 F (36.3 C)   Resp 16   Ht 5\' 9"  (1.753 m)   Wt 253 lb (114.8 kg)   SpO2 94%   BMI 37.36 kg/m   BP/Weight 09/15/2019 08/04/2019 08/04/2019  Systolic BP 132 130 134  Diastolic BP 84 88 78  Wt. (Lbs) 253 246  246  BMI 37.36 36.33 36.33    Physical Exam Vitals reviewed.  Constitutional:      Appearance: Normal appearance.  HENT:     Head: Normocephalic and atraumatic.     Right Ear: Tympanic membrane, ear canal and external ear normal.     Left Ear: Tympanic membrane, ear canal and external ear normal.     Nose: Nose normal.     Mouth/Throat:     Mouth: Mucous membranes are moist.     Pharynx: Oropharynx is clear.  Eyes:     Extraocular Movements: Extraocular movements intact.     Conjunctiva/sclera: Conjunctivae normal.     Pupils: Pupils are equal, round, and reactive to light.  Cardiovascular:     Rate and Rhythm: Normal rate and regular rhythm.     Pulses: Normal pulses.     Heart sounds: Normal heart sounds.  Pulmonary:     Effort: Pulmonary effort is normal.     Breath sounds: Normal breath sounds.  Abdominal:     General: Abdomen is flat. Bowel sounds are normal.     Palpations: Abdomen is soft.  Genitourinary:    Penis: Normal.      Testes: Normal.     Prostate: Normal.     Rectum: Normal.  Musculoskeletal:        General: Normal range of motion.     Cervical back: Neck supple.  Skin:    General: Skin is warm.     Capillary Refill: Capillary refill takes less than 2 seconds.  Neurological:     General: No focal deficit present.     Mental Status: He is alert and oriented to person, place, and time. Mental status is at baseline.  Psychiatric:        Mood and Affect: Mood normal.        Behavior: Behavior normal.        Thought Content: Thought content normal.         Judgment: Judgment normal.   PFT: FVC 76%, FEV1 74% FEV1/FVC 101, PEF 42%  Some obstruction/restiriction  Lab Results  Component Value Date   WBC 8.8 09/15/2019   HGB 14.0 09/15/2019   HCT 41.8 09/15/2019   PLT 341 09/15/2019   GLUCOSE 100 (H) 09/15/2019   CHOL 186 09/15/2019   TRIG 126 09/15/2019   HDL 46 09/15/2019   LDLCALC 117 (H) 09/15/2019   ALT 29 09/15/2019   AST 22 09/15/2019   NA 140 09/15/2019   K 4.4 09/15/2019   CL 102 09/15/2019   CREATININE 0.86 09/15/2019   BUN 17 09/15/2019   CO2 26 09/15/2019   HGBA1C 5.7 (H) 07/10/2019      Assessment & Plan:  1. Routine physical examination - CBC with Differential/Platelet - Comprehensive metabolic panel - Lipid panel - Pulmonary Function Test - PSA  2. Precordial pain - Ambulatory referral to Cardiology    Body mass index is 37.36 kg/m.   These are the goals we discussed: Goals   None      This is a list of the screening recommended for you and due dates:  Health Maintenance  Topic Date Due  .  Hepatitis C: One time screening is recommended by Center for Disease Control  (CDC) for  adults born from [birth year] through 1967.   Never done  . COVID-19 Vaccine (1) Never done  . HIV Screening  Never done  . Tetanus Vaccine  Never done  . Colon Cancer Screening  Never done  .  Flu Shot  10/01/2019     AN INDIVIDUALIZED CARE PLAN: was established or reinforced today.   SELF MANAGEMENT: The patient and I together assessed ways to personally work towards obtaining the recommended goals  Support needs The patient and/or family needs were assessed and services were offered if appropriate.  No orders of the defined types were placed in this encounter.   Follow-up: Return in about 6 months (around 03/17/2020) for fasting.  An After Visit Summary was printed and given to the patient.  Brent Bulla [facility] [phone]

## 2019-09-16 LAB — COMPREHENSIVE METABOLIC PANEL
ALT: 29 IU/L (ref 0–44)
AST: 22 IU/L (ref 0–40)
Albumin/Globulin Ratio: 1.9 (ref 1.2–2.2)
Albumin: 4.6 g/dL (ref 3.8–4.9)
Alkaline Phosphatase: 76 IU/L (ref 48–121)
BUN/Creatinine Ratio: 20 (ref 9–20)
BUN: 17 mg/dL (ref 6–24)
Bilirubin Total: 0.6 mg/dL (ref 0.0–1.2)
CO2: 26 mmol/L (ref 20–29)
Calcium: 9.7 mg/dL (ref 8.7–10.2)
Chloride: 102 mmol/L (ref 96–106)
Creatinine, Ser: 0.86 mg/dL (ref 0.76–1.27)
GFR calc Af Amer: 112 mL/min/{1.73_m2} (ref >59)
GFR calc non Af Amer: 97 mL/min/{1.73_m2} (ref >59)
Globulin, Total: 2.4 g/dL (ref 1.5–4.5)
Glucose: 100 mg/dL — ABNORMAL HIGH (ref 65–99)
Potassium: 4.4 mmol/L (ref 3.5–5.2)
Sodium: 140 mmol/L (ref 134–144)
Total Protein: 7 g/dL (ref 6.0–8.5)

## 2019-09-16 LAB — CBC WITH DIFFERENTIAL/PLATELET
Basophils Absolute: 0.1 10*3/uL (ref 0.0–0.2)
Basos: 2 %
EOS (ABSOLUTE): 0.2 10*3/uL (ref 0.0–0.4)
Eos: 2 %
Hematocrit: 41.8 % (ref 37.5–51.0)
Hemoglobin: 14 g/dL (ref 13.0–17.7)
Immature Grans (Abs): 0.1 10*3/uL (ref 0.0–0.1)
Immature Granulocytes: 1 %
Lymphocytes Absolute: 2.1 10*3/uL (ref 0.7–3.1)
Lymphs: 24 %
MCH: 28.5 pg (ref 26.6–33.0)
MCHC: 33.5 g/dL (ref 31.5–35.7)
MCV: 85 fL (ref 79–97)
Monocytes Absolute: 0.6 10*3/uL (ref 0.1–0.9)
Monocytes: 7 %
Neutrophils Absolute: 5.6 10*3/uL (ref 1.4–7.0)
Neutrophils: 64 %
Platelets: 341 10*3/uL (ref 150–450)
RBC: 4.91 x10E6/uL (ref 4.14–5.80)
RDW: 13.2 % (ref 11.6–15.4)
WBC: 8.8 10*3/uL (ref 3.4–10.8)

## 2019-09-16 LAB — LIPID PANEL
Chol/HDL Ratio: 4 ratio (ref 0.0–5.0)
Cholesterol, Total: 186 mg/dL (ref 100–199)
HDL: 46 mg/dL (ref >39)
LDL Chol Calc (NIH): 117 mg/dL — ABNORMAL HIGH (ref 0–99)
Triglycerides: 126 mg/dL (ref 0–149)
VLDL Cholesterol Cal: 23 mg/dL (ref 5–40)

## 2019-09-16 LAB — CARDIOVASCULAR RISK ASSESSMENT

## 2019-09-16 LAB — PSA: Prostate Specific Ag, Serum: 0.6 ng/mL (ref 0.0–4.0)

## 2019-09-16 NOTE — Progress Notes (Signed)
Cbc normal, glucose 100, kidney and liver tests normal, LDL cholesterol high 117 consider statin to lower cardiac risk, PSA 0.6 normal lp

## 2019-09-29 ENCOUNTER — Other Ambulatory Visit: Payer: Self-pay

## 2019-09-29 ENCOUNTER — Ambulatory Visit (INDEPENDENT_AMBULATORY_CARE_PROVIDER_SITE_OTHER): Payer: 59 | Admitting: Vascular Surgery

## 2019-09-29 ENCOUNTER — Encounter: Payer: Self-pay | Admitting: Vascular Surgery

## 2019-09-29 ENCOUNTER — Ambulatory Visit: Payer: 59 | Admitting: Cardiology

## 2019-09-29 VITALS — BP 132/90 | HR 74 | Temp 97.8°F | Resp 20 | Ht 69.0 in | Wt 252.0 lb

## 2019-09-29 DIAGNOSIS — M25562 Pain in left knee: Secondary | ICD-10-CM

## 2019-09-29 DIAGNOSIS — M25561 Pain in right knee: Secondary | ICD-10-CM

## 2019-09-29 NOTE — Progress Notes (Signed)
Patient ID: Alexander Hicks, male   DOB: 1965/06/30, 54 y.o.   MRN: [id number]  Reason for Consult: New Patient (Initial Visit) and PVD   Referred by Abigail Miyamoto,*  Subjective:     HPI:  Alexander Hicks is a 54 y.o. male with history of brain aneurysm that required hospitalization for approximately 4 weeks.  This was approximately 15 years ago.  He subsequently underwent therapy.  After that he noted bilateral lower extremity weakness.  He has undergone injections in his back on at least 3 occasions which did help some of his pain and weakness.  He has bilateral proximal muscle as well as distal muscle pain he does not have any tissue loss or ulceration.  He states that in the melanite he has significant cramping.  He does not have cramping with walking other than his standard amount of cramping.  This occurs in both his proximal muscles as well as bilateral below the knees.  Occasionally has forefoot pain as well.  States that he is unable to rotate completely with his back given previous hard labor and an injury when he was younger.  He is able to walk on his own.  Past Medical History:  Diagnosis Date  . Brain aneurysm   . Chest pain 06/16/2019  . Contact dermatitis and eczema due to plant 07/19/2019  . DDD (degenerative disc disease), lumbar 07/14/2013  . Dyspnea 06/16/2019  . Episodic tension type headache 05/04/2019  . Essential hypertension   . Family history of premature CAD 06/16/2019  . Gastro-esophageal reflux disease without esophagitis   . Generalized anxiety disorder   . GERD (gastroesophageal reflux disease)   . Hypertriglyceridemia 07/10/2019  . Low back pain 07/14/2013   Formatting of this note might be different from the original. IMO Problem List Replacer Jan. 2016  . Lumbar facet arthropathy 08/25/2013  . Metabolic syndrome 07/10/2019  . Nontoxic single thyroid nodule   . Obesity (BMI 30-39.9) 07/10/2019  . Precordial pain 07/10/2019  . Presbyopia 05/05/2013  .  Vitamin D deficiency 04/15/2018   Family History  Problem Relation Age of Onset  . Hypertension Mother   . Pneumonia Mother   . Hypertension Father   . Cancer Father   . Cancer Sister   . Hypertension Brother    Past Surgical History:  Procedure Laterality Date  . VASECTOMY      Short Social History:  Social History   Tobacco Use  . Smoking status: Never Smoker  . Smokeless tobacco: Current User    Types: Snuff  Substance Use Topics  . Alcohol use: Never    Comment: rarely    Allergies  Allergen Reactions  . Amoxil [Amoxicillin] Shortness Of Breath  . Hydromorphone Anaphylaxis, Rash and Swelling    Swelling.    Marland Kitchen Penicillins Anaphylaxis and Swelling    Throat Hives as well.  Hives as well.    Marland Kitchen Phenytoin Sodium Extended Swelling    Current Outpatient Medications  Medication Sig Dispense Refill  . ALPRAZolam (XANAX) 0.25 MG tablet TAKE 1 TABLET (0.25 MG TOTAL) BY MOUTH 2 (TWO) TIMES DAILY AS NEEDED FOR ANXIETY. 30 tablet 3  . amLODipine (NORVASC) 10 MG tablet Take 1 tablet (10 mg total) by mouth daily. 90 tablet 2  . APPLE CIDER VINEGAR PO Take by mouth daily. [place]oli gummies    . aspirin EC 81 MG tablet Take 81 mg by mouth daily.    . Cholecalciferol (VITAMIN D3) 10 MCG (400 UNIT) CAPS     .  citalopram (CELEXA) 10 MG tablet Take 10 mg by mouth daily.    . Garlic 10 MG CAPS     . lisinopril (ZESTRIL) 20 MG tablet Take 1 tablet (20 mg total) by mouth daily. 30 tablet 6  . meclizine (ANTIVERT) 25 MG tablet Take 1 tablet (25 mg total) by mouth 3 (three) times daily as needed for dizziness. 30 tablet 2  . Multiple Vitamins-Minerals (EMERGEN-C IMMUNE PO) Take by mouth daily.    . nitroGLYCERIN (NITROSTAT) 0.4 MG SL tablet Place 1 tablet (0.4 mg total) under the tongue every 5 (five) minutes as needed. 25 tablet 3  . Potassium (POTASSIMIN) 75 MG TABS      No current facility-administered medications for this visit.    Review of Systems  Constitutional:   Constitutional negative. HENT: HENT negative.  Eyes: Eyes negative.  Respiratory: Respiratory negative.  Cardiovascular: Cardiovascular negative.  GI: Gastrointestinal negative.  Musculoskeletal: Positive for back pain, gait problem and leg pain.  Hematologic: Hematologic/lymphatic negative.  Psychiatric: Psychiatric negative.        Objective:  Objective   Vitals:   09/29/19 0948  BP: (!) 132/90  Pulse: 74  Resp: 20  Temp: 97.8 F (36.6 C)  TempSrc: Temporal  SpO2: 96%  Weight: (!) 252 lb (114.3 kg)  Height: 5\' 9"  (1.753 m)   Body mass index is 37.21 kg/m.  Physical Exam Constitutional:      Appearance: Normal appearance.  HENT:     Head: Normocephalic.     Nose:     Comments: Wearing a mask Eyes:     Pupils: Pupils are equal, round, and reactive to light.  Neck:     Vascular: No carotid bruit.  Cardiovascular:     Rate and Rhythm: Regular rhythm.     Pulses:          Radial pulses are 2+ on the right side and 2+ on the left side.       Popliteal pulses are 2+ on the right side and 2+ on the left side.       Dorsalis pedis pulses are 0 on the right side and 2+ on the left side.       Posterior tibial pulses are 2+ on the right side and 2+ on the left side.  Pulmonary:     Effort: Pulmonary effort is normal.  Abdominal:     General: Abdomen is flat.     Palpations: Abdomen is soft. There is no mass.  Musculoskeletal:        General: No swelling. Normal range of motion.  Skin:    General: Skin is warm and dry.     Capillary Refill: Capillary refill takes less than 2 seconds.  Neurological:     General: No focal deficit present.     Mental Status: He is alert.  Psychiatric:        Mood and Affect: Mood normal.        Behavior: Behavior normal.        Thought Content: Thought content normal.        Judgment: Judgment normal.     Data: ABI Summary:  Right: Resting right ankle-brachial index is within normal range. No  evidence of significant right  lower extremity arterial disease.   Left: Resting left ankle-brechial index is within normal range. No  evidence of significant left lower extremity arterial disease.    *See table(s) above for measurements and observations.      Electronically signed by  Bing Matter MD on 08/31/2019 at 12:13:04 PM.      Assessment/Plan:     54 year old male with bilateral lower extremity pain.  This appears to be both cramping and possibly neurologic in nature.  He has palpable distal pulses ABIs are well preserved.  I do not see any necessity of vascular intervention.  He can follow-up with me on an as-needed basis.     Maeola Harman MD [facility]

## 2019-10-09 ENCOUNTER — Other Ambulatory Visit: Payer: Self-pay | Admitting: Legal Medicine

## 2019-10-09 DIAGNOSIS — R42 Dizziness and giddiness: Secondary | ICD-10-CM

## 2019-10-16 ENCOUNTER — Ambulatory Visit (INDEPENDENT_AMBULATORY_CARE_PROVIDER_SITE_OTHER): Payer: 59 | Admitting: Legal Medicine

## 2019-10-16 ENCOUNTER — Encounter: Payer: Self-pay | Admitting: Legal Medicine

## 2019-10-16 ENCOUNTER — Other Ambulatory Visit: Payer: Self-pay

## 2019-10-16 VITALS — BP 110/70 | HR 82 | Temp 97.6°F | Resp 16 | Ht 69.0 in | Wt 250.0 lb

## 2019-10-16 DIAGNOSIS — L247 Irritant contact dermatitis due to plants, except food: Secondary | ICD-10-CM | POA: Diagnosis not present

## 2019-10-16 MED ORDER — TRIAMCINOLONE ACETONIDE 40 MG/ML IJ SUSP
80.0000 mg | Freq: Once | INTRAMUSCULAR | Status: AC
  Administered 2019-10-16: 80 mg via INTRAMUSCULAR

## 2019-10-16 NOTE — Progress Notes (Signed)
Acute Office Visit  Subjective:    Patient ID: Alexander Hicks, male    DOB: 11/10/1965, 54 y.o.   MRN: [id number]  Chief Complaint  Patient presents with  . Rash    HPI Patient is in today for poison IVY exposure 2 days a go and now has rash and weeping eyes.  No wheezing .  Past Medical History:  Diagnosis Date  . Brain aneurysm   . Chest pain 06/16/2019  . Contact dermatitis and eczema due to plant 07/19/2019  . DDD (degenerative disc disease), lumbar 07/14/2013  . Dyspnea 06/16/2019  . Episodic tension type headache 05/04/2019  . Essential hypertension   . Family history of premature CAD 06/16/2019  . Gastro-esophageal reflux disease without esophagitis   . Generalized anxiety disorder   . GERD (gastroesophageal reflux disease)   . Hypertriglyceridemia 07/10/2019  . Low back pain 07/14/2013   Formatting of this note might be different from the original. IMO Problem List Replacer Jan. 2016  . Lumbar facet arthropathy 08/25/2013  . Metabolic syndrome 07/10/2019  . Nontoxic single thyroid nodule   . Obesity (BMI 30-39.9) 07/10/2019  . Precordial pain 07/10/2019  . Presbyopia 05/05/2013  . Vitamin D deficiency 04/15/2018    Past Surgical History:  Procedure Laterality Date  . VASECTOMY      Family History  Problem Relation Age of Onset  . Hypertension Mother   . Pneumonia Mother   . Hypertension Father   . Cancer Father   . Cancer Sister   . Hypertension Brother     Social History   Socioeconomic History  . Marital status: Married    Spouse name: Not on file  . Number of children: 2  . Years of education: Not on file  . Highest education level: Not on file  Occupational History  . Not on file  Tobacco Use  . Smoking status: Never Smoker  . Smokeless tobacco: Current User    Types: Snuff  Vaping Use  . Vaping Use: Never used  Substance and Sexual Activity  . Alcohol use: Never    Comment: rarely  . Drug use: Never  . Sexual activity: Yes    Partners:  Female  Other Topics Concern  . Not on file  Social History Narrative  . Not on file   Social Determinants of Health   Financial Resource Strain:   . Difficulty of Paying Living Expenses:   Food Insecurity:   . Worried About Programme researcher, broadcasting/film/video in the Last Year:   . Barista in the Last Year:   Transportation Needs:   . Freight forwarder (Medical):   Marland Kitchen Lack of Transportation (Non-Medical):   Physical Activity:   . Days of Exercise per Week:   . Minutes of Exercise per Session:   Stress:   . Feeling of Stress :   Social Connections:   . Frequency of Communication with Friends and Family:   . Frequency of Social Gatherings with Friends and Family:   . Attends Religious Services:   . Active Member of Clubs or Organizations:   . Attends Banker Meetings:   Marland Kitchen Marital Status:   Intimate Partner Violence:   . Fear of Current or Ex-Partner:   . Emotionally Abused:   Marland Kitchen Physically Abused:   . Sexually Abused:     Outpatient Medications Prior to Visit  Medication Sig Dispense Refill  . ALPRAZolam (XANAX) 0.25 MG tablet TAKE 1 TABLET (0.25 MG TOTAL) BY  MOUTH 2 (TWO) TIMES DAILY AS NEEDED FOR ANXIETY. 30 tablet 3  . amLODipine (NORVASC) 10 MG tablet Take 1 tablet (10 mg total) by mouth daily. 90 tablet 2  . APPLE CIDER VINEGAR PO Take by mouth daily. Goli gummies    . aspirin EC 81 MG tablet Take 81 mg by mouth daily.    . Cholecalciferol (VITAMIN D3) 10 MCG (400 UNIT) CAPS     . citalopram (CELEXA) 10 MG tablet Take 10 mg by mouth daily.    . Garlic 10 MG CAPS     . lisinopril (ZESTRIL) 20 MG tablet Take 1 tablet (20 mg total) by mouth daily. 30 tablet 6  . meclizine (ANTIVERT) 25 MG tablet TAKE 1 TABLET (25 MG TOTAL) BY MOUTH 3 (THREE) TIMES DAILY AS NEEDED FOR DIZZINESS. 30 tablet 2  . Multiple Vitamins-Minerals (EMERGEN-C IMMUNE PO) Take by mouth daily.    . nitroGLYCERIN (NITROSTAT) 0.4 MG SL tablet Place 1 tablet (0.4 mg total) under the tongue every 5  (five) minutes as needed. 25 tablet 3  . Potassium (POTASSIMIN) 75 MG TABS      No facility-administered medications prior to visit.    Allergies  Allergen Reactions  . Amoxil [Amoxicillin] Shortness Of Breath  . Hydromorphone Anaphylaxis, Rash and Swelling    Swelling.    Marland Kitchen Penicillins Anaphylaxis and Swelling    Throat Hives as well.  Hives as well.    Marland Kitchen Phenytoin Sodium Extended Swelling    Review of Systems  Constitutional: Negative.   HENT: Negative.   Eyes: Positive for itching.  Respiratory: Negative.   Cardiovascular: Negative.   Gastrointestinal: Negative.   Musculoskeletal: Negative.   Skin: Positive for rash.  Neurological: Negative.   Psychiatric/Behavioral: Negative.        Objective:    Physical Exam Vitals reviewed.  Constitutional:      Appearance: Normal appearance.  HENT:     Right Ear: Tympanic membrane normal.     Left Ear: Tympanic membrane normal.  Eyes:     Extraocular Movements: Extraocular movements intact.     Conjunctiva/sclera: Conjunctivae normal.     Pupils: Pupils are equal, round, and reactive to light.     Comments: eylids swollen  Abdominal:     General: Abdomen is flat. Bowel sounds are normal.     Palpations: Abdomen is soft.  Musculoskeletal:        General: Normal range of motion.     Cervical back: Neck supple.  Skin:    Capillary Refill: Capillary refill takes less than 2 seconds.  Neurological:     General: No focal deficit present.     Mental Status: He is alert and oriented to person, place, and time. Mental status is at baseline.     BP 110/70   Pulse 82   Temp 97.6 F (36.4 C)   Resp 16   Ht 5\' 9"  (1.753 m)   Wt 250 lb (113.4 kg)   SpO2 97%   BMI 36.92 kg/m  Wt Readings from Last 3 Encounters:  10/16/19 250 lb (113.4 kg)  09/29/19 (!) 252 lb (114.3 kg)  09/15/19 253 lb (114.8 kg)    Health Maintenance Due  Topic Date Due  . Hepatitis C Screening  Never done  . COVID-19 Vaccine (1) Never done    . HIV Screening  Never done  . TETANUS/TDAP  Never done  . COLONOSCOPY  Never done  . INFLUENZA VACCINE  10/01/2019    There are no  preventive care reminders to display for this patient.   No results found for: TSH Lab Results  Component Value Date   WBC 8.8 09/15/2019   HGB 14.0 09/15/2019   HCT 41.8 09/15/2019   MCV 85 09/15/2019   PLT 341 09/15/2019   Lab Results  Component Value Date   NA 140 09/15/2019   K 4.4 09/15/2019   CO2 26 09/15/2019   GLUCOSE 100 (H) 09/15/2019   BUN 17 09/15/2019   CREATININE 0.86 09/15/2019   BILITOT 0.6 09/15/2019   ALKPHOS 76 09/15/2019   AST 22 09/15/2019   ALT 29 09/15/2019   PROT 7.0 09/15/2019   ALBUMIN 4.6 09/15/2019   CALCIUM 9.7 09/15/2019   Lab Results  Component Value Date   CHOL 186 09/15/2019   Lab Results  Component Value Date   HDL 46 09/15/2019   Lab Results  Component Value Date   LDLCALC 117 (H) 09/15/2019   Lab Results  Component Value Date   TRIG 126 09/15/2019   Lab Results  Component Value Date   CHOLHDL 4.0 09/15/2019   Lab Results  Component Value Date   HGBA1C 5.7 (H) 07/10/2019       Assessment & Plan:  1. Contact dermatitis and eczema due to plant Patient has rash on arms and around eyes, no respiratory embarrassment.   Kenalog 80mg  IM given     Follow-up: Return if symptoms worsen or fail to improve.  An After Visit Summary was printed and given to the patient.  [facility] [phone]

## 2019-11-10 ENCOUNTER — Other Ambulatory Visit: Payer: Self-pay

## 2019-11-10 ENCOUNTER — Ambulatory Visit (INDEPENDENT_AMBULATORY_CARE_PROVIDER_SITE_OTHER): Payer: 59 | Admitting: Legal Medicine

## 2019-11-10 ENCOUNTER — Encounter: Payer: Self-pay | Admitting: Legal Medicine

## 2019-11-10 VITALS — BP 130/82 | HR 86 | Temp 97.7°F | Resp 16 | Ht 69.0 in | Wt 249.0 lb

## 2019-11-10 DIAGNOSIS — F411 Generalized anxiety disorder: Secondary | ICD-10-CM

## 2019-11-10 DIAGNOSIS — I10 Essential (primary) hypertension: Secondary | ICD-10-CM | POA: Diagnosis not present

## 2019-11-10 DIAGNOSIS — R252 Cramp and spasm: Secondary | ICD-10-CM

## 2019-11-10 DIAGNOSIS — E559 Vitamin D deficiency, unspecified: Secondary | ICD-10-CM

## 2019-11-10 DIAGNOSIS — E781 Pure hyperglyceridemia: Secondary | ICD-10-CM | POA: Diagnosis not present

## 2019-11-10 DIAGNOSIS — E8881 Metabolic syndrome: Secondary | ICD-10-CM

## 2019-11-10 HISTORY — DX: Cramp and spasm: R25.2

## 2019-11-10 MED ORDER — PEN NEEDLES 31G X 5 MM MISC: 1.0000 | Freq: Every day | 2 refills | Status: DC

## 2019-11-10 MED ORDER — VICTOZA 18 MG/3ML ~~LOC~~ SOPN: 1.8000 mg | PEN_INJECTOR | Freq: Every day | SUBCUTANEOUS | 6 refills | Status: DC

## 2019-11-10 NOTE — Patient Instructions (Signed)

## 2019-11-10 NOTE — Progress Notes (Signed)
Subjective:  Patient ID: Alexander Hicks, male    DOB: 1965/08/11  Age: 54 y.o. MRN: [id number]  Chief Complaint  Patient presents with  . Hyperlipidemia  . Hypertension  . Anxiety    HPI: chronic visit  Patient presents for follow up of hypertension.  Patient tolerating amlodipine well with side effects.  Patient was diagnosed with hypertension, lisinopril  so has been treated for hypertension for 2010 years.Patient is working on maintaining diet and exercise regimen and follows up as directed. Complication include none.  Patient presents with hyperlipidemia.  Compliance with treatment has been good; patient takes medicines as directed, maintains low cholesterol diet, follows up as directed, and maintains exercise regimen.  Patient is using none without problems.   Cramping in arms and leg at night. He has degenerative disc disease.   Current Outpatient Medications on File Prior to Visit  Medication Sig Dispense Refill  . ALPRAZolam (XANAX) 0.25 MG tablet TAKE 1 TABLET (0.25 MG TOTAL) BY MOUTH 2 (TWO) TIMES DAILY AS NEEDED FOR ANXIETY. 30 tablet 3  . amLODipine (NORVASC) 10 MG tablet Take 1 tablet (10 mg total) by mouth daily. 90 tablet 2  . APPLE CIDER VINEGAR PO Take by mouth daily. Goli gummies    . aspirin EC 81 MG tablet Take 81 mg by mouth daily.    . Cholecalciferol (VITAMIN D3) 10 MCG (400 UNIT) CAPS     . citalopram (CELEXA) 10 MG tablet Take 10 mg by mouth daily.    . Garlic 10 MG CAPS     . lisinopril (ZESTRIL) 20 MG tablet Take 1 tablet (20 mg total) by mouth daily. 30 tablet 6  . meclizine (ANTIVERT) 25 MG tablet TAKE 1 TABLET (25 MG TOTAL) BY MOUTH 3 (THREE) TIMES DAILY AS NEEDED FOR DIZZINESS. 30 tablet 2  . Multiple Vitamins-Minerals (EMERGEN-C IMMUNE PO) Take by mouth daily.    . nitroGLYCERIN (NITROSTAT) 0.4 MG SL tablet Place 1 tablet (0.4 mg total) under the tongue every 5 (five) minutes as needed. 25 tablet 3  . Potassium (POTASSIMIN) 75 MG TABS      No  current facility-administered medications on file prior to visit.   Past Medical History:  Diagnosis Date  . Brain aneurysm   . Chest pain 06/16/2019  . Contact dermatitis and eczema due to plant 07/19/2019  . DDD (degenerative disc disease), lumbar 07/14/2013  . Dyspnea 06/16/2019  . Episodic tension type headache 05/04/2019  . Essential hypertension   . Family history of premature CAD 06/16/2019  . Gastro-esophageal reflux disease without esophagitis   . Generalized anxiety disorder   . GERD (gastroesophageal reflux disease)   . Hypertriglyceridemia 07/10/2019  . Low back pain 07/14/2013   Formatting of this note might be different from the original. IMO Problem List Replacer Jan. 2016  . Lumbar facet arthropathy 08/25/2013  . Metabolic syndrome 07/10/2019  . Nontoxic single thyroid nodule   . Obesity (BMI 30-39.9) 07/10/2019  . Precordial pain 07/10/2019  . Presbyopia 05/05/2013  . Vitamin D deficiency 04/15/2018   Past Surgical History:  Procedure Laterality Date  . VASECTOMY      Family History  Problem Relation Age of Onset  . Hypertension Mother   . Pneumonia Mother   . Hypertension Father   . Cancer Father   . Cancer Sister   . Hypertension Brother    Social History   Socioeconomic History  . Marital status: Married    Spouse name: Not on file  . Number  of children: 2  . Years of education: Not on file  . Highest education level: Not on file  Occupational History  . Not on file  Tobacco Use  . Smoking status: Never Smoker  . Smokeless tobacco: Current User    Types: Snuff  Vaping Use  . Vaping Use: Never used  Substance and Sexual Activity  . Alcohol use: Never    Comment: rarely  . Drug use: Never  . Sexual activity: Yes    Partners: Female  Other Topics Concern  . Not on file  Social History Narrative  . Not on file   Social Determinants of Health   Financial Resource Strain:   . Difficulty of Paying Living Expenses: Not on file  Food Insecurity:   .  Worried About Programme researcher, broadcasting/film/video in the Last Year: Not on file  . Ran Out of Food in the Last Year: Not on file  Transportation Needs:   . Lack of Transportation (Medical): Not on file  . Lack of Transportation (Non-Medical): Not on file  Physical Activity:   . Days of Exercise per Week: Not on file  . Minutes of Exercise per Session: Not on file  Stress:   . Feeling of Stress : Not on file  Social Connections:   . Frequency of Communication with Friends and Family: Not on file  . Frequency of Social Gatherings with Friends and Family: Not on file  . Attends Religious Services: Not on file  . Active Member of Clubs or Organizations: Not on file  . Attends Banker Meetings: Not on file  . Marital Status: Not on file    Review of Systems  Constitutional: Negative.   HENT: Negative.   Eyes: Negative.   Respiratory: Negative.  Negative for cough, shortness of breath and stridor.   Cardiovascular: Negative for chest pain, palpitations and leg swelling.  Gastrointestinal: Negative.   Genitourinary: Negative.   Musculoskeletal: Negative.   Skin: Negative.   Psychiatric/Behavioral: Negative.      Objective:  BP 130/82   Pulse 86   Temp 97.7 F (36.5 C)   Resp 16   Ht 5\' 9"  (1.753 m)   Wt 249 lb (112.9 kg)   SpO2 97%   BMI 36.77 kg/m   BP/Weight 11/10/2019 10/16/2019 09/29/2019  Systolic BP 130 110 132  Diastolic BP 82 70 90  Wt. (Lbs) 249 250 252  BMI 36.77 36.92 37.21    Physical Exam Vitals reviewed.  Constitutional:      Appearance: Normal appearance.  HENT:     Head: Normocephalic and atraumatic.     Right Ear: Tympanic membrane, ear canal and external ear normal.     Left Ear: Tympanic membrane, ear canal and external ear normal.     Nose: Nose normal.     Mouth/Throat:     Mouth: Mucous membranes are moist.     Pharynx: Oropharynx is clear.  Eyes:     Conjunctiva/sclera: Conjunctivae normal.     Pupils: Pupils are equal, round, and reactive  to light.  Cardiovascular:     Rate and Rhythm: Normal rate and regular rhythm.     Pulses: Normal pulses.     Heart sounds: Normal heart sounds.  Pulmonary:     Effort: Pulmonary effort is normal.     Breath sounds: Normal breath sounds.  Abdominal:     General: Abdomen is flat. Bowel sounds are normal.     Palpations: Abdomen is soft.  Musculoskeletal:  General: Normal range of motion.     Cervical back: Normal range of motion and neck supple.     Comments: No cogwheel, no clonus, no muscle wasting  Skin:    General: Skin is dry.     Capillary Refill: Capillary refill takes less than 2 seconds.  Neurological:     Mental Status: He is alert and oriented to person, place, and time. Mental status is at baseline.     Diabetic Foot Exam - Simple   Simple Foot Form Diabetic Foot exam was performed with the following findings: Yes 11/10/2019  9:13 AM  Visual Inspection No deformities, no ulcerations, no other skin breakdown bilaterally: Yes Sensation Testing See comments: Yes Pulse Check Posterior Tibialis and Dorsalis pulse intact bilaterally: Yes Comments Decreased sensation feet      Lab Results  Component Value Date   WBC 8.2 11/10/2019   HGB 14.4 11/10/2019   HCT 43.8 11/10/2019   PLT 335 11/10/2019   GLUCOSE 96 11/10/2019   CHOL 191 11/10/2019   TRIG 106 11/10/2019   HDL 45 11/10/2019   LDLCALC 127 (H) 11/10/2019   ALT 33 11/10/2019   AST 27 11/10/2019   NA 141 11/10/2019   K 4.5 11/10/2019   CL 103 11/10/2019   CREATININE 0.85 11/10/2019   BUN 18 11/10/2019   CO2 25 11/10/2019   TSH 1.180 11/10/2019   HGBA1C 5.8 (H) 11/10/2019      Assessment & Plan:    Diagnoses and all orders for this visit: Hypertriglyceridemia  AN INDIVIDUAL CARE PLAN for hyperlipidemia/ cholesterol was established and reinforced today.  The patient's status was assessed using clinical findings on exam, lab and other diagnostic tests. The patient's disease status was  assessed based on evidence-based guidelines and found to be well controlled. MEDICATIONS were reviewed. SELF MANAGEMENT GOALS have been discussed and patient's success at attaining the goal of low cholesterol was assessed. RECOMMENDATION given include regular exercise 3 days a week and low cholesterol/low fat diet. CLINICAL SUMMARY including written plan to identify barriers unique to the patient due to social or economic  reasons was discussed.  Generalized anxiety disorder AN INDIVIDUAL CARE PLAN for anxiety was established and reinforced today.  The patient's status was assessed using clinical findings on exam, labs, and other diagnostic testing. Patient's success at meeting treatment goals based on disease specific evidence-bassed guidelines and found to be in good control. RECOMMENDATIONS include maintaining present medicines and treatment.  Essential hypertension -     CBC with Differential/Platelet -     Comprehensive metabolic panel -     Lipid panel An individual hypertension care plan was established and reinforced today.  The patient's status was assessed using clinical findings on exam and labs or diagnostic tests. The patient's success at meeting treatment goals on disease specific evidence-based guidelines and found to be well controlled. SELF MANAGEMENT: The patient and I together assessed ways to personally work towards obtaining the recommended goals. RECOMMENDATIONS: avoid decongestants found in common cold remedies, decrease consumption of alcohol, perform routine monitoring of BP with home BP cuff, exercise, reduction of dietary salt, take medicines as prescribed, try not to miss doses and quit smoking.  Regular exercise and maintaining a healthy weight is needed.  Stress reduction may help. A CLINICAL SUMMARY including written plan identify barriers to care unique to individual due to social or financial issues.  We attempt to mutually creat solutions for individual and family  understanding.  Vitamin D deficiency Vitamin D normal  Metabolic syndrome  -     Hemoglobin A1c -     Magnesium -     TSH -     Nerve conduction test; Future -     liraglutide (VICTOZA) 18 MG/3ML SOPN; Inject 1.8 mg into the skin daily. -     Insulin Pen Needle (PEN NEEDLES) 31G X 5 MM MISC; 1 each by Does not apply route daily. Patient meets the criteria for metabolic syndrome but no frank DM He needs to lose weight to help control this.  Muscle cramping -     Nerve conduction test; Future Chronic recurrent cramping in arms and legs, normal pulses, get NCS to see if there is cervical stenosis or primary muscle disease  Other orders -     Cardiovascular Risk Assessment     Orders Placed This Encounter  Procedures  . CBC with Differential/Platelet  . Comprehensive metabolic panel  . Lipid panel  . Hemoglobin A1c  . Magnesium  . TSH  . Cardiovascular Risk Assessment  . Nerve conduction test     Follow-up: Return in about 3 months (around 02/09/2020) for fasting.  An After Visit Summary was printed and given to the patient.  [facility] [phone]

## 2019-11-11 LAB — LIPID PANEL
Chol/HDL Ratio: 4.2 ratio (ref 0.0–5.0)
Cholesterol, Total: 191 mg/dL (ref 100–199)
HDL: 45 mg/dL (ref >39)
LDL Chol Calc (NIH): 127 mg/dL — ABNORMAL HIGH (ref 0–99)
Triglycerides: 106 mg/dL (ref 0–149)
VLDL Cholesterol Cal: 19 mg/dL (ref 5–40)

## 2019-11-11 LAB — COMPREHENSIVE METABOLIC PANEL
ALT: 33 IU/L (ref 0–44)
AST: 27 IU/L (ref 0–40)
Albumin/Globulin Ratio: 1.7 (ref 1.2–2.2)
Albumin: 4.5 g/dL (ref 3.8–4.9)
Alkaline Phosphatase: 71 IU/L (ref 48–121)
BUN/Creatinine Ratio: 21 — ABNORMAL HIGH (ref 9–20)
BUN: 18 mg/dL (ref 6–24)
Bilirubin Total: 0.6 mg/dL (ref 0.0–1.2)
CO2: 25 mmol/L (ref 20–29)
Calcium: 10 mg/dL (ref 8.7–10.2)
Chloride: 103 mmol/L (ref 96–106)
Creatinine, Ser: 0.85 mg/dL (ref 0.76–1.27)
GFR calc Af Amer: 113 mL/min/{1.73_m2} (ref >59)
GFR calc non Af Amer: 97 mL/min/{1.73_m2} (ref >59)
Globulin, Total: 2.6 g/dL (ref 1.5–4.5)
Glucose: 96 mg/dL (ref 65–99)
Potassium: 4.5 mmol/L (ref 3.5–5.2)
Sodium: 141 mmol/L (ref 134–144)
Total Protein: 7.1 g/dL (ref 6.0–8.5)

## 2019-11-11 LAB — CBC WITH DIFFERENTIAL/PLATELET
Basophils Absolute: 0.1 10*3/uL (ref 0.0–0.2)
Basos: 1 %
EOS (ABSOLUTE): 0.1 10*3/uL (ref 0.0–0.4)
Eos: 2 %
Hematocrit: 43.8 % (ref 37.5–51.0)
Hemoglobin: 14.4 g/dL (ref 13.0–17.7)
Immature Grans (Abs): 0.1 10*3/uL (ref 0.0–0.1)
Immature Granulocytes: 1 %
Lymphocytes Absolute: 2.1 10*3/uL (ref 0.7–3.1)
Lymphs: 26 %
MCH: 28.4 pg (ref 26.6–33.0)
MCHC: 32.9 g/dL (ref 31.5–35.7)
MCV: 86 fL (ref 79–97)
Monocytes Absolute: 0.7 10*3/uL (ref 0.1–0.9)
Monocytes: 8 %
Neutrophils Absolute: 5.1 10*3/uL (ref 1.4–7.0)
Neutrophils: 62 %
Platelets: 335 10*3/uL (ref 150–450)
RBC: 5.07 x10E6/uL (ref 4.14–5.80)
RDW: 12.7 % (ref 11.6–15.4)
WBC: 8.2 10*3/uL (ref 3.4–10.8)

## 2019-11-11 LAB — HEMOGLOBIN A1C
Est. average glucose Bld gHb Est-mCnc: 120 mg/dL
Hgb A1c MFr Bld: 5.8 % — ABNORMAL HIGH (ref 4.8–5.6)

## 2019-11-11 LAB — CARDIOVASCULAR RISK ASSESSMENT

## 2019-11-11 LAB — MAGNESIUM: Magnesium: 2.1 mg/dL (ref 1.6–2.3)

## 2019-11-11 LAB — TSH: TSH: 1.18 u[IU]/mL (ref 0.450–4.500)

## 2019-11-12 NOTE — Progress Notes (Signed)
CBC normal, kidney and liver tests normal, LDL-c 127 high, need to consider moderate dose  statin, A1c 5.8, Magnesium 2.1 normal, TSH 1.18 normal lp

## 2019-11-24 ENCOUNTER — Ambulatory Visit: Payer: 59 | Admitting: Cardiology

## 2019-12-11 ENCOUNTER — Other Ambulatory Visit: Payer: Self-pay | Admitting: Legal Medicine

## 2019-12-11 DIAGNOSIS — F411 Generalized anxiety disorder: Secondary | ICD-10-CM

## 2019-12-11 DIAGNOSIS — I10 Essential (primary) hypertension: Secondary | ICD-10-CM

## 2019-12-13 DIAGNOSIS — K219 Gastro-esophageal reflux disease without esophagitis: Secondary | ICD-10-CM | POA: Insufficient documentation

## 2019-12-14 ENCOUNTER — Ambulatory Visit (INDEPENDENT_AMBULATORY_CARE_PROVIDER_SITE_OTHER): Payer: 59 | Admitting: Cardiology

## 2019-12-14 ENCOUNTER — Encounter: Payer: Self-pay | Admitting: Cardiology

## 2019-12-14 ENCOUNTER — Other Ambulatory Visit: Payer: Self-pay

## 2019-12-14 VITALS — BP 120/80 | HR 82 | Ht 69.0 in | Wt 251.0 lb

## 2019-12-14 DIAGNOSIS — E781 Pure hyperglyceridemia: Secondary | ICD-10-CM | POA: Diagnosis not present

## 2019-12-14 DIAGNOSIS — I739 Peripheral vascular disease, unspecified: Secondary | ICD-10-CM | POA: Diagnosis not present

## 2019-12-14 DIAGNOSIS — I10 Essential (primary) hypertension: Secondary | ICD-10-CM

## 2019-12-14 DIAGNOSIS — R072 Precordial pain: Secondary | ICD-10-CM

## 2019-12-14 DIAGNOSIS — E8881 Metabolic syndrome: Secondary | ICD-10-CM

## 2019-12-14 NOTE — Patient Instructions (Signed)
Medication Instructions:  Your physician recommends that you continue on your current medications as directed. Please refer to the Current Medication list given to you today.  *If you need a refill on your cardiac medications before your next appointment, please call your pharmacy*   Lab Work: None. If you have labs (blood work) drawn today and your tests are completely normal, you will receive your results only by: Marland Kitchen MyChart Message (if you have MyChart) OR . A paper copy in the mail If you have any lab test that is abnormal or we need to change your treatment, we will call you to review the results.   Testing/Procedures: Your physician has requested that you have a lower or upper extremity arterial duplex. This test is an ultrasound of the arteries in the legs or arms. It looks at arterial blood flow in the legs and arms. Allow one hour for Lower and Upper Arterial scans. There are no restrictions or special instructions    Follow-Up: At [facility], you and your health needs are our priority.  As part of our continuing mission to provide you with exceptional heart care, we have created designated Provider Care Teams.  These Care Teams include your primary Cardiologist (physician) and Advanced Practice Providers (APPs -  Physician Assistants and Nurse Practitioners) who all work together to provide you with the care you need, when you need it.  We recommend signing up for the patient portal called "MyChart".  Sign up information is provided on this After Visit Summary.  MyChart is used to connect with patients for Virtual Visits (Telemedicine).  Patients are able to view lab/test results, encounter notes, upcoming appointments, etc.  Non-urgent messages can be sent to your provider as well.   To learn more about what you can do with MyChart, go to [email or web address].    Your next appointment:   1 year(s)  The format for your next appointment:   In Person  Provider:   Thomasene Ripple, DO   Other Instructions

## 2019-12-14 NOTE — Progress Notes (Signed)
Cardiology Office Note:    Date:  12/14/2019   ID:  Alexander Hicks, DOB [birth year], MRN [id number]  PCP:  Abigail Miyamoto, MD  Cardiologist:  Thomasene Ripple, DO  Electrophysiologist:  None   Referring MD: Abigail Miyamoto,*  " I am still having leg pain when I walk"  History of Present Illness:    Alexander Hicks is a 54 y.o. male with a hx of hypertension, history of brain aneurysm and anxiety disorder.    at his last visit his blood pressure had improved on  the amlodipine 10 mg daily and the lisinopril 20 mg daily.  We discussed his stress test which was normal he told me his chest pain had improved some but was still intermittent.  He also did complain of concerns for intermittent claudication I recommended the patient get ABI.    In the interim the patient had ABI which was reported to be normal.   He is here today for follow-up visit.    The patient tells me from his heart standpoint he is doing well however he still having some pain in his leg when he is walking.   Past Medical History:  Diagnosis Date  . Brain aneurysm   . Chest pain 06/16/2019  . Contact dermatitis and eczema due to plant 07/19/2019  . DDD (degenerative disc disease), lumbar 07/14/2013  . Dyspnea 06/16/2019  . Episodic tension type headache 05/04/2019  . Essential hypertension   . Family history of premature CAD 06/16/2019  . Gastro-esophageal reflux disease without esophagitis   . Generalized anxiety disorder   . GERD (gastroesophageal reflux disease)   . Hypertriglyceridemia 07/10/2019  . Low back pain 07/14/2013   Formatting of this note might be different from the original. IMO Problem List Replacer Jan. 2016  . Lumbar facet arthropathy 08/25/2013  . Metabolic syndrome 07/10/2019  . Muscle cramping 11/10/2019  . Nontoxic single thyroid nodule   . Obesity (BMI 30-39.9) 07/10/2019  . Precordial pain 07/10/2019  . Presbyopia 05/05/2013  . Vitamin D deficiency 04/15/2018    Past Surgical  History:  Procedure Laterality Date  . VASECTOMY      Current Medications: Current Meds  Medication Sig  . ALPRAZolam (XANAX) 0.25 MG tablet TAKE 1 TABLET (0.25 MG TOTAL) BY MOUTH 2 (TWO) TIMES DAILY AS NEEDED FOR ANXIETY.  Marland Kitchen amLODipine (NORVASC) 10 MG tablet Take 1 tablet (10 mg total) by mouth daily.  . APPLE CIDER VINEGAR PO Take by mouth daily. [place]oli gummies  . aspirin EC 81 MG tablet Take 81 mg by mouth daily.  . Cholecalciferol (VITAMIN D3) 10 MCG (400 UNIT) CAPS   . citalopram (CELEXA) 10 MG tablet Take 10 mg by mouth daily.  . citalopram (CELEXA) 20 MG tablet TAKE 1 TABLET (20 MG TOTAL) BY MOUTH DAILY.  . Garlic 10 MG CAPS   . Insulin Pen Needle (PEN NEEDLES) 31G X 5 MM MISC 1 each by Does not apply route daily.  Marland Kitchen liraglutide (VICTOZA) 18 MG/3ML SOPN Inject 1.8 mg into the skin daily.  Marland Kitchen lisinopril (ZESTRIL) 20 MG tablet TAKE 1 TABLET (20 MG TOTAL) BY MOUTH DAILY.  . meclizine (ANTIVERT) 25 MG tablet TAKE 1 TABLET (25 MG TOTAL) BY MOUTH 3 (THREE) TIMES DAILY AS NEEDED FOR DIZZINESS.  . Multiple Vitamins-Minerals (EMERGEN-C IMMUNE PO) Take by mouth daily.  . nitroGLYCERIN (NITROSTAT) 0.4 MG SL tablet Place 1 tablet (0.4 mg total) under the tongue every 5 (five) minutes as needed.  . Potassium (POTASSIMIN)  75 MG TABS      Allergies:   Amoxil [amoxicillin], Hydromorphone, Penicillins, and Phenytoin sodium extended   Social History   Socioeconomic History  . Marital status: Married    Spouse name: Not on file  . Number of children: 2  . Years of education: Not on file  . Highest education level: Not on file  Occupational History  . Not on file  Tobacco Use  . Smoking status: Never Smoker  . Smokeless tobacco: Current User    Types: Snuff  Vaping Use  . Vaping Use: Never used  Substance and Sexual Activity  . Alcohol use: Never    Comment: rarely  . Drug use: Never  . Sexual activity: Yes    Partners: Female  Other Topics Concern  . Not on file  Social History  Narrative  . Not on file   Social Determinants of Health   Financial Resource Strain:   . Difficulty of Paying Living Expenses: Not on file  Food Insecurity:   . Worried About Programme researcher, broadcasting/film/video in the Last Year: Not on file  . Ran Out of Food in the Last Year: Not on file  Transportation Needs:   . Lack of Transportation (Medical): Not on file  . Lack of Transportation (Non-Medical): Not on file  Physical Activity:   . Days of Exercise per Week: Not on file  . Minutes of Exercise per Session: Not on file  Stress:   . Feeling of Stress : Not on file  Social Connections:   . Frequency of Communication with Friends and Family: Not on file  . Frequency of Social Gatherings with Friends and Family: Not on file  . Attends Religious Services: Not on file  . Active Member of Clubs or Organizations: Not on file  . Attends Banker Meetings: Not on file  . Marital Status: Not on file     Family History: The patient's family history includes Cancer in his father and sister; Hypertension in his brother, father, and mother; Pneumonia in his mother.  ROS:   Review of Systems  Constitution: Negative for decreased appetite, fever and weight gain.  HENT: Negative for congestion, ear discharge, hoarse voice and sore throat.   Eyes: Negative for discharge, redness, vision loss in right eye and visual halos.  Cardiovascular: Negative for chest pain, dyspnea on exertion, leg swelling, orthopnea and palpitations.  Respiratory: Negative for cough, hemoptysis, shortness of breath and snoring.   Endocrine: Negative for heat intolerance and polyphagia.  Hematologic/Lymphatic: Negative for bleeding problem. Does not bruise/bleed easily.  Skin: Negative for flushing, nail changes, rash and suspicious lesions.  Musculoskeletal: Negative for arthritis, joint pain, muscle cramps, myalgias, neck pain and stiffness.  Gastrointestinal: Negative for abdominal pain, bowel incontinence, diarrhea  and excessive appetite.  Genitourinary: Negative for decreased libido, genital sores and incomplete emptying.  Neurological: Negative for brief paralysis, focal weakness, headaches and loss of balance.  Psychiatric/Behavioral: Negative for altered mental status, depression and suicidal ideas.  Allergic/Immunologic: Negative for HIV exposure and persistent infections.    EKGs/Labs/Other Studies Reviewed:    The following studies were reviewed today:   EKG: None today  Summary For ABI. Right: Resting right ankle-brachial index is within normal range. No  evidence of significant right lower extremity arterial disease.   Left: Resting left ankle-brechial index is within normal range. No  evidence of significant left lower extremity arterial disease.   TTE IMPRESSIONS  1. Left ventricular ejection fraction, by estimation, is  60 to 65%. The left ventricle has normal function. The left ventricle has no regional wall motion abnormalities. There is mild concentric left ventricular  hypertrophy. Left ventricular diastolic parameters were normal.  2. Right ventricular systolic function is normal. The right ventricular size is normal. There is normal pulmonary artery systolic pressure.  3. The mitral valve is normal in structure. Trivial mitral valve regurgitation. No evidence of mitral stenosis.  4. The aortic valve is normal in structure. Aortic valve regurgitation is not visualized. No aortic stenosis is present.  5. The inferior vena cava is normal in size with greater than 50% respiratory variability, suggesting right atrial pressure of 3 mmHg.   Pharmacologic nuclear stress test  There was no ST segment deviation noted during stress.  The study is normal.  This is a low risk study.  The left ventricular ejection fraction is normal (55-65%).  Recent Labs: 11/10/2019: ALT 33; BUN 18; Creatinine, Ser 0.85; Hemoglobin 14.4; Magnesium 2.1; Platelets 335; Potassium 4.5; Sodium 141;  TSH 1.180  Recent Lipid Panel    Component Value Date/Time   CHOL 191 11/10/2019 0936   TRIG 106 11/10/2019 0936   HDL 45 11/10/2019 0936   CHOLHDL 4.2 11/10/2019 0936   LDLCALC 127 (H) 11/10/2019 0936    Physical Exam:    VS:  BP 120/80 (BP Location: Right Arm, Patient Position: Sitting, Cuff Size: Normal)   Pulse 82   Ht 5\' 9"  (1.753 m)   Wt 251 lb (113.9 kg)   SpO2 92%   BMI 37.07 kg/m     Wt Readings from Last 3 Encounters:  12/14/19 251 lb (113.9 kg)  11/10/19 249 lb (112.9 kg)  10/16/19 250 lb (113.4 kg)     GEN: Well nourished, well developed in no acute distress HEENT: Normal NECK: No JVD; No carotid bruits LYMPHATICS: No lymphadenopathy CARDIAC: S1S2 noted,RRR, no murmurs, rubs, gallops RESPIRATORY:  Clear to auscultation without rales, wheezing or rhonchi  ABDOMEN: Soft, non-tender, non-distended, +bowel sounds, no guarding. EXTREMITIES: No edema, No cyanosis, no clubbing MUSCULOSKELETAL:  No deformity  SKIN: Warm and dry NEUROLOGIC:  Alert and oriented x 3, non-focal PSYCHIATRIC:  Normal affect, good insight  ASSESSMENT:    1. Intermittent claudication (HCC)   2. Essential hypertension   3. Hypertriglyceridemia   4. Metabolic syndrome   5. Precordial pain    PLAN:    From a cardiovascular standpoint he appears to be doing well.  His blood pressure has improved significantly no changes will be made to his antihypertensive regimen.  Diabetes is being managed by his primary care doctor.  I have again ordered a bilateral lower extremity arterial Doppler for rule out intermittent claudication. The patient understands the need to lose weight with diet and exercise. We have discussed specific strategies for this.  The patient is in agreement with the above plan. The patient left the office in stable condition.  The patient will follow up in 1 year or sooner if needed.  Medication Adjustments/Labs and Tests Ordered: Current medicines are reviewed at  length with the patient today.  Concerns regarding medicines are outlined above.  Orders Placed This Encounter  Procedures  . VAS 10/18/19 LOWER EXTREMITY ARTERIAL DUPLEX   No orders of the defined types were placed in this encounter.   Patient Instructions  Medication Instructions:  Your physician recommends that you continue on your current medications as directed. Please refer to the Current Medication list given to you today.  *If you need a refill on  your cardiac medications before your next appointment, please call your pharmacy*   Lab Work: None. If you have labs (blood work) drawn today and your tests are completely normal, you will receive your results only by: Marland Kitchen MyChart Message (if you have MyChart) OR . A paper copy in the mail If you have any lab test that is abnormal or we need to change your treatment, we will call you to review the results.   Testing/Procedures: Your physician has requested that you have a lower or upper extremity arterial duplex. This test is an ultrasound of the arteries in the legs or arms. It looks at arterial blood flow in the legs and arms. Allow one hour for Lower and Upper Arterial scans. There are no restrictions or special instructions    Follow-Up: At [facility], you and your health needs are our priority.  As part of our continuing mission to provide you with exceptional heart care, we have created designated Provider Care Teams.  These Care Teams include your primary Cardiologist (physician) and Advanced Practice Providers (APPs -  Physician Assistants and Nurse Practitioners) who all work together to provide you with the care you need, when you need it.  We recommend signing up for the patient portal called "MyChart".  Sign up information is provided on this After Visit Summary.  MyChart is used to connect with patients for Virtual Visits (Telemedicine).  Patients are able to view lab/test results, encounter notes, upcoming appointments, etc.   Non-urgent messages can be sent to your provider as well.   To learn more about what you can do with MyChart, go to [email or web address].    Your next appointment:   1 year(s)  The format for your next appointment:   In Person  Provider:   Thomasene Ripple, DO   Other Instructions       Adopting a Healthy Lifestyle.  Know what a healthy weight is for you (roughly BMI <25) and aim to maintain this   Aim for 7+ servings of fruits and vegetables daily   65-80+ fluid ounces of water or unsweet tea for healthy kidneys   Limit to max 1 drink of alcohol per day; avoid smoking/tobacco   Limit animal fats in diet for cholesterol and heart health - choose grass fed whenever available   Avoid highly processed foods, and foods high in saturated/trans fats   Aim for low stress - take time to unwind and care for your mental health   Aim for 150 min of moderate intensity exercise weekly for heart health, and weights twice weekly for bone health   Aim for 7-9 hours of sleep daily   When it comes to diets, agreement about the perfect plan isnt easy to find, even among the experts. Experts at the [facility] of [employer] developed an idea known as the Healthy Eating Plate. Just imagine a plate divided into logical, healthy portions.   The emphasis is on diet quality:   Load up on vegetables and fruits - one-half of your plate: Aim for color and variety, and remember that potatoes dont count.   Go for whole grains - one-quarter of your plate: Whole wheat, barley, wheat berries, quinoa, oats, brown rice, and foods made with them. If you want pasta, go with whole wheat pasta.   Protein power - one-quarter of your plate: Fish, chicken, beans, and nuts are all healthy, versatile protein sources. Limit red meat.   The diet, however, does go beyond the plate, offering a  few other suggestions.   Use healthy plant oils, such as olive, canola, soy, corn, sunflower and peanut. Check  the labels, and avoid partially hydrogenated oil, which have unhealthy trans fats.   If youre thirsty, drink water. Coffee and tea are good in moderation, but skip sugary drinks and limit milk and dairy products to one or two daily servings.   The type of carbohydrate in the diet is more important than the amount. Some sources of carbohydrates, such as vegetables, fruits, whole grains, and beans-are healthier than others.   Finally, stay active  Signed, Thomasene RippleKardie Olusegun Gerstenberger, DO  12/14/2019 4:24 PM    [facility]

## 2020-01-10 ENCOUNTER — Other Ambulatory Visit: Payer: Self-pay | Admitting: Legal Medicine

## 2020-01-10 DIAGNOSIS — F411 Generalized anxiety disorder: Secondary | ICD-10-CM

## 2020-01-24 ENCOUNTER — Ambulatory Visit (INDEPENDENT_AMBULATORY_CARE_PROVIDER_SITE_OTHER): Payer: 59

## 2020-01-24 ENCOUNTER — Other Ambulatory Visit: Payer: Self-pay

## 2020-01-24 DIAGNOSIS — I739 Peripheral vascular disease, unspecified: Secondary | ICD-10-CM | POA: Diagnosis not present

## 2020-01-24 NOTE — Progress Notes (Signed)
Lower extremity arterial duplex exam performed..  Jimmy Joci Dress RDCS, RVT  

## 2020-01-29 ENCOUNTER — Telehealth: Payer: Self-pay

## 2020-01-29 NOTE — Telephone Encounter (Signed)
-----   Message from Thomasene Ripple, DO sent at 01/24/2020  9:32 PM EST ----- Bilateral lower extremity duplex normal.

## 2020-01-29 NOTE — Telephone Encounter (Signed)
Spoke with patient regarding results and recommendation.  Patient verbalizes understanding and is agreeable to plan of care. Advised patient to call back with any issues or concerns.  

## 2020-02-12 ENCOUNTER — Other Ambulatory Visit: Payer: Self-pay | Admitting: Legal Medicine

## 2020-02-12 DIAGNOSIS — I10 Essential (primary) hypertension: Secondary | ICD-10-CM

## 2020-02-16 ENCOUNTER — Ambulatory Visit: Payer: 59 | Admitting: Legal Medicine

## 2020-02-20 ENCOUNTER — Other Ambulatory Visit: Payer: Self-pay | Admitting: Legal Medicine

## 2020-02-20 DIAGNOSIS — E8881 Metabolic syndrome: Secondary | ICD-10-CM

## 2020-03-22 ENCOUNTER — Ambulatory Visit: Payer: 59 | Admitting: Legal Medicine

## 2020-04-05 ENCOUNTER — Other Ambulatory Visit: Payer: Self-pay

## 2020-04-05 ENCOUNTER — Encounter: Payer: Self-pay | Admitting: Legal Medicine

## 2020-04-05 ENCOUNTER — Ambulatory Visit: Payer: 59 | Admitting: Legal Medicine

## 2020-04-05 VITALS — BP 126/70 | HR 81 | Temp 97.4°F | Resp 17 | Ht 69.0 in | Wt 243.4 lb

## 2020-04-05 DIAGNOSIS — M5136 Other intervertebral disc degeneration, lumbar region: Secondary | ICD-10-CM

## 2020-04-05 DIAGNOSIS — E781 Pure hyperglyceridemia: Secondary | ICD-10-CM | POA: Diagnosis not present

## 2020-04-05 DIAGNOSIS — F411 Generalized anxiety disorder: Secondary | ICD-10-CM

## 2020-04-05 DIAGNOSIS — E8881 Metabolic syndrome: Secondary | ICD-10-CM

## 2020-04-05 DIAGNOSIS — I10 Essential (primary) hypertension: Secondary | ICD-10-CM

## 2020-04-05 DIAGNOSIS — I739 Peripheral vascular disease, unspecified: Secondary | ICD-10-CM

## 2020-04-05 DIAGNOSIS — E559 Vitamin D deficiency, unspecified: Secondary | ICD-10-CM | POA: Diagnosis not present

## 2020-04-05 DIAGNOSIS — K219 Gastro-esophageal reflux disease without esophagitis: Secondary | ICD-10-CM

## 2020-04-05 MED ORDER — VICTOZA 18 MG/3ML ~~LOC~~ SOPN: 1.8000 mg | PEN_INJECTOR | Freq: Every day | SUBCUTANEOUS | 6 refills | Status: DC

## 2020-04-05 MED ORDER — CITALOPRAM HYDROBROMIDE 10 MG PO TABS
20.0000 mg | ORAL_TABLET | Freq: Every day | ORAL | 2 refills | Status: AC
Start: 2020-04-05 — End: ?

## 2020-04-05 NOTE — Progress Notes (Signed)
Subjective:  Patient ID: Alexander Hicks, male    DOB: 1965-03-12  Age: 55 y.o. MRN: [id number]  Chief Complaint  Patient presents with  . Hypertension  . Gastroesophageal Reflux    HPI: chronic visit  Patient presents for follow up of hypertension.  Patient tolerating amlodipine, lisinopril well with side effects.  Patient was diagnosed with hypertension amlodipine, lisinopril so has been treated for hypertension for 2010 years.Patient is working on maintaining diet and exercise regimen and follows up as directed. Complication include none.  Patient has gastroesophageal reflux symptoms withesophagitis and LTRD.  The symptoms are moderate intensity.  Length of symptoms 10 years.  Medicines include none.  Complications include none  Weight loss on victoza.  Increase to 1.8mg .   Current Outpatient Medications on File Prior to Visit  Medication Sig Dispense Refill  . ALPRAZolam (XANAX) 0.25 MG tablet TAKE 1 TABLET (0.25 MG TOTAL) BY MOUTH 2 (TWO) TIMES DAILY AS NEEDED FOR ANXIETY. 30 tablet 3  . amLODipine (NORVASC) 10 MG tablet TAKE 1 TABLET (10 MG TOTAL) BY MOUTH DAILY. 90 tablet 2  . aspirin EC 81 MG tablet Take 81 mg by mouth daily.    . Cholecalciferol (VITAMIN D3) 10 MCG (400 UNIT) CAPS     . GLOBAL EASE INJECT PEN NEEDLES 31G X 5 MM MISC USE WITH VICTOZA 100 each 3  . lisinopril (ZESTRIL) 20 MG tablet TAKE 1 TABLET (20 MG TOTAL) BY MOUTH DAILY. 30 tablet 6  . meclizine (ANTIVERT) 25 MG tablet TAKE 1 TABLET (25 MG TOTAL) BY MOUTH 3 (THREE) TIMES DAILY AS NEEDED FOR DIZZINESS. 30 tablet 2  . Multiple Vitamins-Minerals (EMERGEN-C IMMUNE PO) Take by mouth daily.    . nitroGLYCERIN (NITROSTAT) 0.4 MG SL tablet Place 1 tablet (0.4 mg total) under the tongue every 5 (five) minutes as needed. 25 tablet 3  . Potassium (POTASSIMIN) 75 MG TABS      No current facility-administered medications on file prior to visit.   Past Medical History:  Diagnosis Date  . Brain aneurysm   . Chest  pain 06/16/2019  . Contact dermatitis and eczema due to plant 07/19/2019  . DDD (degenerative disc disease), lumbar 07/14/2013  . Dyspnea 06/16/2019  . Episodic tension type headache 05/04/2019  . Essential hypertension   . Family history of premature CAD 06/16/2019  . Gastro-esophageal reflux disease without esophagitis   . Generalized anxiety disorder   . GERD (gastroesophageal reflux disease)   . Hypertriglyceridemia 07/10/2019  . Low back pain 07/14/2013   Formatting of this note might be different from the original. IMO Problem List Replacer Jan. 2016  . Lumbar facet arthropathy 08/25/2013  . Metabolic syndrome 07/10/2019  . Muscle cramping 11/10/2019  . Nontoxic single thyroid nodule   . Obesity (BMI 30-39.9) 07/10/2019  . Precordial pain 07/10/2019  . Presbyopia 05/05/2013  . Vitamin D deficiency 04/15/2018   Past Surgical History:  Procedure Laterality Date  . VASECTOMY      Family History  Problem Relation Age of Onset  . Hypertension Mother   . Pneumonia Mother   . Hypertension Father   . Cancer Father   . Cancer Sister   . Hypertension Brother    Social History   Socioeconomic History  . Marital status: Married    Spouse name: Not on file  . Number of children: 2  . Years of education: Not on file  . Highest education level: Not on file  Occupational History  . Not on file  Tobacco Use  . Smoking status: Never Smoker  . Smokeless tobacco: Current User    Types: Snuff  Vaping Use  . Vaping Use: Never used  Substance and Sexual Activity  . Alcohol use: Never    Comment: rarely  . Drug use: Never  . Sexual activity: Yes    Partners: Female  Other Topics Concern  . Not on file  Social History Narrative  . Not on file   Social Determinants of Health   Financial Resource Strain: Not on file  Food Insecurity: Not on file  Transportation Needs: Not on file  Physical Activity: Not on file  Stress: Not on file  Social Connections: Not on file    Review of  Systems  Constitutional: Negative for activity change, appetite change and unexpected weight change.  HENT: Negative for congestion and sinus pain.   Eyes: Negative for visual disturbance.  Respiratory: Negative for chest tightness and shortness of breath.   Cardiovascular: Negative for chest pain, palpitations and leg swelling.  Gastrointestinal: Negative for abdominal distention and abdominal pain.  Endocrine: Negative for polyuria.  Genitourinary: Negative for difficulty urinating, dysuria and urgency.  Musculoskeletal: Negative for arthralgias and back pain.  Skin: Negative.   Neurological: Negative.   Psychiatric/Behavioral: Negative.      Objective:  BP 126/70 (BP Location: Left Arm, Patient Position: Sitting, Cuff Size: Normal)   Pulse 81   Temp (!) 97.4 F (36.3 C) (Temporal)   Resp 17   Ht 5\' 9"  (1.753 m)   Wt 243 lb 6.4 oz (110.4 kg)   SpO2 94%   BMI 35.94 kg/m   BP/Weight 04/05/2020 12/14/2019 11/10/2019  Systolic BP 126 120 130  Diastolic BP 70 80 82  Wt. (Lbs) 243.4 251 249  BMI 35.94 37.07 36.77    Physical Exam Vitals reviewed.  Constitutional:      General: He is not in acute distress.    Appearance: Normal appearance.  HENT:     Right Ear: Tympanic membrane, ear canal and external ear normal.     Left Ear: Tympanic membrane, ear canal and external ear normal.     Mouth/Throat:     Mouth: Mucous membranes are moist.     Pharynx: Oropharynx is clear.  Eyes:     Extraocular Movements: Extraocular movements intact.     Conjunctiva/sclera: Conjunctivae normal.     Pupils: Pupils are equal, round, and reactive to light.  Cardiovascular:     Rate and Rhythm: Normal rate and regular rhythm.     Pulses: Normal pulses.     Heart sounds: Normal heart sounds. No murmur heard. No gallop.   Pulmonary:     Effort: Pulmonary effort is normal. No respiratory distress.     Breath sounds: Normal breath sounds. No rales.  Abdominal:     General: Abdomen is flat.  Bowel sounds are normal. There is no distension.     Palpations: Abdomen is soft.     Tenderness: There is no abdominal tenderness.  Musculoskeletal:        General: Normal range of motion.     Cervical back: Normal range of motion and neck supple.  Skin:    General: Skin is warm.     Capillary Refill: Capillary refill takes less than 2 seconds.  Neurological:     General: No focal deficit present.     Mental Status: He is alert and oriented to person, place, and time. Mental status is at baseline.  Psychiatric:  Mood and Affect: Mood normal.        Thought Content: Thought content normal.        Judgment: Judgment normal.      Lab Results  Component Value Date   WBC 7.8 04/05/2020   HGB 14.9 04/05/2020   HCT 43.6 04/05/2020   PLT 351 04/05/2020   GLUCOSE 101 (H) 04/05/2020   CHOL 176 04/05/2020   TRIG 139 04/05/2020   HDL 38 (L) 04/05/2020   LDLCALC 113 (H) 04/05/2020   ALT 25 04/05/2020   AST 20 04/05/2020   NA 143 04/05/2020   K 4.4 04/05/2020   CL 102 04/05/2020   CREATININE 0.95 04/05/2020   BUN 12 04/05/2020   CO2 25 04/05/2020   TSH 1.180 11/10/2019   HGBA1C 5.8 (H) 04/05/2020      Assessment & Plan:  Diagnoses and all orders for this visit: Hypertriglyceridemia -     Lipid panel AN INDIVIDUAL CARE PLAN for hyperlipidemia/ cholesterol was established and reinforced today.  The patient's status was assessed using clinical findings on exam, lab and other diagnostic tests. The patient's disease status was assessed based on evidence-based guidelines and found to be fair controlled. MEDICATIONS were reviewed  . SELF MANAGEMENT GOALS have been discussed and patient's success at attaining the goal of low cholesterol was assessed. RECOMMENDATION given include regular exercise 3 days a week and low cholesterol/low fat diet. CLINICAL SUMMARY including written plan to identify barriers unique to the patient due to social or economic  reasons was  discussed.  Generalized anxiety disorder AN INDIVIDUAL CARE PLAN for GAD was established and reinforced today.  The patient's status was assessed using clinical findings on exam, labs, and other diagnostic testing. Patient's success at meeting treatment goals based on disease specific evidence-bassed guidelines and found to be in fair control. RECOMMENDATIONS include maintaining present medicines and treatment.  Essential hypertension -     CBC with Differential/Platelet -     Comprehensive metabolic panel -     Hemoglobin A1c An individual hypertension care plan was established and reinforced today.  The patient's status was assessed using clinical findings on exam and labs or diagnostic tests. The patient's success at meeting treatment goals on disease specific evidence-based guidelines and found to be good controlled. SELF MANAGEMENT: The patient and I together assessed ways to personally work towards obtaining the recommended goals. RECOMMENDATIONS: avoid decongestants found in common cold remedies, decrease consumption of alcohol, perform routine monitoring of BP with home BP cuff, exercise, reduction of dietary salt, take medicines as prescribed, try not to miss doses and quit smoking.  Regular exercise and maintaining a healthy weight is needed.  Stress reduction may help. A CLINICAL SUMMARY including written plan identify barriers to care unique to individual due to social or financial issues.  We attempt to mutually creat solutions for individual and family understanding.  Vitamin D deficiency AN INDIVIDUAL CARE PLAN vitamin D deficiency was established and reinforced today.  The patient's status was assessed using clinical findings on exam, labs, and other diagnostic testing. Patient's success at meeting treatment goals based on disease specific evidence-bassed guidelines and found to be in good control.  RECOMMENDATIONS include maintaining present medicines and treatment. Metabolic  syndrome -     liraglutide (VICTOZA) 18 MG/3ML SOPN; Inject 1.8 mg into the skin daily. Patient is on liraglutide for control of DM and metabolic syndrome along with weight loss.  PVD (peripheral vascular disease) with claudication (HCC) AN INDIVIDUAL CARE PLAN for PAD  was established and reinforced today.  The patient's status was assessed using clinical findings on exam, labs, and other diagnostic testing. Patient's success at meeting treatment goals based on disease specific evidence-bassed guidelines and found to be in fair control. RECOMMENDATIONS include maintaining present medicines and treatment.  Gastro-esophageal reflux disease without esophagitis Plan of care was formulated today.  She is doing well.  A plan of care was formulated using patient exam, tests and other sources to optimize care using evidence based information.  Recommend no smoking, no eating after supper, avoid fatty foods, elevate Head of bed, avoid tight fitting clothing.  Continue on OTC meds .  DDD (degenerative disc disease), lumbar -     Ambulatory referral to Orthopedic Surgery [facility] severe hip PA that failed cortisone injections.  I recommended he consider [facility].  Refer to Dr. Sherlean Foot.  I spent 35 minutes dedicated to the care of this patient on the date of this encounter to include face-to-face time with the patient, as well as: review ortho records  Follow-up: Return in about 6 months (around 10/03/2020) for fasting.  An After Visit Summary was printed and given to the patient.  Brent Bulla, MD [facility] [phone]

## 2020-04-06 LAB — COMPREHENSIVE METABOLIC PANEL
ALT: 25 IU/L (ref 0–44)
AST: 20 IU/L (ref 0–40)
Albumin/Globulin Ratio: 2 (ref 1.2–2.2)
Albumin: 4.7 g/dL (ref 3.8–4.9)
Alkaline Phosphatase: 73 IU/L (ref 44–121)
BUN/Creatinine Ratio: 13 (ref 9–20)
BUN: 12 mg/dL (ref 6–24)
Bilirubin Total: 0.6 mg/dL (ref 0.0–1.2)
CO2: 25 mmol/L (ref 20–29)
Calcium: 9.6 mg/dL (ref 8.7–10.2)
Chloride: 102 mmol/L (ref 96–106)
Creatinine, Ser: 0.95 mg/dL (ref 0.76–1.27)
GFR calc Af Amer: 102 mL/min/{1.73_m2} (ref >59)
GFR calc non Af Amer: 88 mL/min/{1.73_m2} (ref >59)
Globulin, Total: 2.4 g/dL (ref 1.5–4.5)
Glucose: 101 mg/dL — ABNORMAL HIGH (ref 65–99)
Potassium: 4.4 mmol/L (ref 3.5–5.2)
Sodium: 143 mmol/L (ref 134–144)
Total Protein: 7.1 g/dL (ref 6.0–8.5)

## 2020-04-06 LAB — HEMOGLOBIN A1C
Est. average glucose Bld gHb Est-mCnc: 120 mg/dL
Hgb A1c MFr Bld: 5.8 % — ABNORMAL HIGH (ref 4.8–5.6)

## 2020-04-06 LAB — CBC WITH DIFFERENTIAL/PLATELET
Basophils Absolute: 0.1 10*3/uL (ref 0.0–0.2)
Basos: 2 %
EOS (ABSOLUTE): 0.1 10*3/uL (ref 0.0–0.4)
Eos: 2 %
Hematocrit: 43.6 % (ref 37.5–51.0)
Hemoglobin: 14.9 g/dL (ref 13.0–17.7)
Immature Grans (Abs): 0 10*3/uL (ref 0.0–0.1)
Immature Granulocytes: 1 %
Lymphocytes Absolute: 2.1 10*3/uL (ref 0.7–3.1)
Lymphs: 26 %
MCH: 28.3 pg (ref 26.6–33.0)
MCHC: 34.2 g/dL (ref 31.5–35.7)
MCV: 83 fL (ref 79–97)
Monocytes Absolute: 0.6 10*3/uL (ref 0.1–0.9)
Monocytes: 8 %
Neutrophils Absolute: 4.9 10*3/uL (ref 1.4–7.0)
Neutrophils: 61 %
Platelets: 351 10*3/uL (ref 150–450)
RBC: 5.26 x10E6/uL (ref 4.14–5.80)
RDW: 12.7 % (ref 11.6–15.4)
WBC: 7.8 10*3/uL (ref 3.4–10.8)

## 2020-04-06 LAB — LIPID PANEL
Chol/HDL Ratio: 4.6 ratio (ref 0.0–5.0)
Cholesterol, Total: 176 mg/dL (ref 100–199)
HDL: 38 mg/dL — ABNORMAL LOW (ref >39)
LDL Chol Calc (NIH): 113 mg/dL — ABNORMAL HIGH (ref 0–99)
Triglycerides: 139 mg/dL (ref 0–149)
VLDL Cholesterol Cal: 25 mg/dL (ref 5–40)

## 2020-04-06 LAB — CARDIOVASCULAR RISK ASSESSMENT

## 2020-04-07 ENCOUNTER — Encounter: Payer: Self-pay | Admitting: Legal Medicine

## 2020-04-07 NOTE — Progress Notes (Signed)
CBC normal, glucose 101, kidney tests normal, liver tests normal, A1c 5.8, LDL 113 high- consider statin,  lp

## 2020-04-10 ENCOUNTER — Telehealth: Payer: Self-pay

## 2020-04-10 NOTE — Telephone Encounter (Signed)
Dr. Marina Goodell please advise.  Dr. Viviann Spare Lucey's office doesn't take [facility] so they didn't take the referral. [facility] was covered do you want me to change referral to them, is that ok?

## 2020-04-10 NOTE — Telephone Encounter (Signed)
Referral sent to [facility].

## 2020-04-10 NOTE — Telephone Encounter (Signed)
Yes lp 

## 2020-05-21 ENCOUNTER — Other Ambulatory Visit: Payer: Self-pay | Admitting: Legal Medicine

## 2020-05-21 DIAGNOSIS — F411 Generalized anxiety disorder: Secondary | ICD-10-CM

## 2020-06-20 ENCOUNTER — Encounter: Payer: Self-pay | Admitting: Gastroenterology

## 2020-06-21 ENCOUNTER — Ambulatory Visit (INDEPENDENT_AMBULATORY_CARE_PROVIDER_SITE_OTHER): Payer: 59 | Admitting: Legal Medicine

## 2020-06-21 ENCOUNTER — Other Ambulatory Visit: Payer: Self-pay

## 2020-06-21 ENCOUNTER — Encounter: Payer: Self-pay | Admitting: Legal Medicine

## 2020-06-21 VITALS — BP 126/90 | HR 71 | Temp 97.5°F | Resp 16 | Ht 69.0 in | Wt 245.0 lb

## 2020-06-21 DIAGNOSIS — R1012 Left upper quadrant pain: Secondary | ICD-10-CM | POA: Diagnosis not present

## 2020-06-21 MED ORDER — TRAMADOL HCL 50 MG PO TABS: 50.0000 mg | ORAL_TABLET | Freq: Three times a day (TID) | ORAL | 0 refills | Status: AC | PRN

## 2020-06-21 NOTE — Progress Notes (Signed)
Subjective:  Patient ID: Alexander Hicks, male    DOB: [birth year]  Age: 55 y.o. MRN: [id number]  Chief Complaint  Patient presents with  . Back Pain    Sharp pain on left side of the back since last week.    HPI: Patient having back pain and just had an epidural injection 4 days ago.  Pain constant on left back. He is also having pain in luq and epigastric area.  He ate pizza last night with diarrhea.  No constipation pain for 2 weeks.  He is on l iraglutide.  Current Outpatient Medications on File Prior to Visit  Medication Sig Dispense Refill  . ALPRAZolam (XANAX) 0.25 MG tablet TAKE 1 TABLET (0.25 MG TOTAL) BY MOUTH 2 (TWO) TIMES DAILY AS NEEDED FOR ANXIETY. 30 tablet 3  . amLODipine (NORVASC) 10 MG tablet TAKE 1 TABLET (10 MG TOTAL) BY MOUTH DAILY. 90 tablet 2  . aspirin EC 81 MG tablet Take 81 mg by mouth daily.    . Cholecalciferol (VITAMIN D3) 10 MCG (400 UNIT) CAPS     . citalopram (CELEXA) 10 MG tablet Take 2 tablets (20 mg total) by mouth daily. 90 tablet 2  . diclofenac (VOLTAREN) 75 MG EC tablet Take 75 mg by mouth 2 (two) times daily.    Marland Kitchen gabapentin (NEURONTIN) 300 MG capsule Take 1 capsule by mouth 3 (three) times daily.    Marland Kitchen GLOBAL EASE INJECT PEN NEEDLES 31G X 5 MM MISC USE WITH VICTOZA 100 each 3  . lisinopril (ZESTRIL) 20 MG tablet TAKE 1 TABLET (20 MG TOTAL) BY MOUTH DAILY. 30 tablet 6  . meclizine (ANTIVERT) 25 MG tablet TAKE 1 TABLET (25 MG TOTAL) BY MOUTH 3 (THREE) TIMES DAILY AS NEEDED FOR DIZZINESS. 30 tablet 2  . Multiple Vitamins-Minerals (EMERGEN-C IMMUNE PO) Take by mouth daily.    . nitroGLYCERIN (NITROSTAT) 0.4 MG SL tablet Place 1 tablet (0.4 mg total) under the tongue every 5 (five) minutes as needed. 25 tablet 3  . Potassium (POTASSIMIN) 75 MG TABS      No current facility-administered medications on file prior to visit.   Past Medical History:  Diagnosis Date  . Brain aneurysm   . Chest pain 06/16/2019  . Contact dermatitis and eczema due to plant  07/19/2019  . DDD (degenerative disc disease), lumbar 07/14/2013  . Dyspnea 06/16/2019  . Episodic tension type headache 05/04/2019  . Essential hypertension   . Family history of premature CAD 06/16/2019  . Gastro-esophageal reflux disease without esophagitis   . Generalized anxiety disorder   . GERD (gastroesophageal reflux disease)   . Hypertriglyceridemia 07/10/2019  . Low back pain 07/14/2013   Formatting of this note might be different from the original. IMO Problem List Replacer Jan. 2016  . Lumbar facet arthropathy 08/25/2013  . Metabolic syndrome 07/10/2019  . Muscle cramping 11/10/2019  . Nontoxic single thyroid nodule   . Obesity (BMI 30-39.9) 07/10/2019  . Precordial pain 07/10/2019  . Presbyopia 05/05/2013  . Vitamin D deficiency 04/15/2018   Past Surgical History:  Procedure Laterality Date  . VASECTOMY      Family History  Problem Relation Age of Onset  . Hypertension Mother   . Pneumonia Mother   . Hypertension Father   . Cancer Father   . Cancer Sister   . Hypertension Brother    Social History   Socioeconomic History  . Marital status: Married    Spouse name: Not on file  . Number of children:  2  . Years of education: Not on file  . Highest education level: Not on file  Occupational History  . Not on file  Tobacco Use  . Smoking status: Never Smoker  . Smokeless tobacco: Current User    Types: Snuff  Vaping Use  . Vaping Use: Never used  Substance and Sexual Activity  . Alcohol use: Never    Comment: rarely  . Drug use: Never  . Sexual activity: Yes    Partners: Female  Other Topics Concern  . Not on file  Social History Narrative  . Not on file   Social Determinants of Health   Financial Resource Strain: Not on file  Food Insecurity: Not on file  Transportation Needs: Not on file  Physical Activity: Not on file  Stress: Not on file  Social Connections: Not on file    Review of Systems  Constitutional: Negative.   HENT: Negative.   Eyes:  Negative.   Respiratory: Negative for chest tightness and shortness of breath.   Cardiovascular: Positive for chest pain (left chest wall lower).  Gastrointestinal: Positive for abdominal pain. Negative for constipation, nausea and vomiting.  Genitourinary: Negative.   Musculoskeletal: Positive for back pain.  Neurological: Negative.      Objective:  BP 126/90   Pulse 71   Temp (!) 97.5 F (36.4 C)   Resp 16   Ht 5\' 9"  (1.753 m)   Wt 245 lb (111.1 kg)   SpO2 97%   BMI 36.18 kg/m   BP/Weight 06/21/2020 04/05/2020 12/14/2019  Systolic BP 126 126 120  Diastolic BP 90 70 80  Wt. (Lbs) 245 243.4 251  BMI 36.18 35.94 37.07    Physical Exam Vitals reviewed.  Constitutional:      Appearance: Normal appearance.  Cardiovascular:     Rate and Rhythm: Normal rate.     Pulses: Normal pulses.     Heart sounds: Normal heart sounds. No murmur heard. No gallop.   Pulmonary:     Effort: Pulmonary effort is normal. No respiratory distress.     Breath sounds: No rales.  Chest:     Chest wall: Tenderness (left chest wall) present.  Abdominal:     General: Abdomen is flat. Bowel sounds are normal.     Palpations: Abdomen is soft.     Tenderness: There is abdominal tenderness (LUQ and epigastric area).  Neurological:     Mental Status: He is alert.       Lab Results  Component Value Date   WBC 8.4 06/21/2020   HGB 14.8 06/21/2020   HCT 43.9 06/21/2020   PLT 318 06/21/2020   GLUCOSE 90 06/21/2020   CHOL 176 04/05/2020   TRIG 139 04/05/2020   HDL 38 (L) 04/05/2020   LDLCALC 113 (H) 04/05/2020   ALT 23 06/21/2020   AST 15 06/21/2020   NA 143 06/21/2020   K 4.2 06/21/2020   CL 101 06/21/2020   CREATININE 0.89 06/21/2020   BUN 14 06/21/2020   CO2 27 06/21/2020   TSH 1.180 11/10/2019   HGBA1C 5.8 (H) 04/05/2020      Assessment & Plan:   Diagnoses and all orders for this visit: Left upper quadrant abdominal pain -     CBC with Differential/Platelet -      Comprehensive metabolic panel -     Lipase -     CT Abdomen Pelvis W Contrast -     traMADol (ULTRAM) 50 MG tablet; Take 1 tablet (50 mg total) by  mouth every 8 (eight) hours as needed for up to 5 days. Check labs for possible pancreatitis from liraglutide.  Patient has already stopped this       Follow-up: Return in about 2 weeks (around 07/05/2020).  An After Visit Summary was printed and given to the patient.  Brent Bulla, MD [facility] [phone]

## 2020-06-22 LAB — CBC WITH DIFFERENTIAL/PLATELET
Basophils Absolute: 0.2 10*3/uL (ref 0.0–0.2)
Basos: 2 %
EOS (ABSOLUTE): 0.2 10*3/uL (ref 0.0–0.4)
Eos: 2 %
Hematocrit: 43.9 % (ref 37.5–51.0)
Hemoglobin: 14.8 g/dL (ref 13.0–17.7)
Immature Grans (Abs): 0.1 10*3/uL (ref 0.0–0.1)
Immature Granulocytes: 1 %
Lymphocytes Absolute: 2.3 10*3/uL (ref 0.7–3.1)
Lymphs: 27 %
MCH: 28.1 pg (ref 26.6–33.0)
MCHC: 33.7 g/dL (ref 31.5–35.7)
MCV: 83 fL (ref 79–97)
Monocytes Absolute: 0.7 10*3/uL (ref 0.1–0.9)
Monocytes: 9 %
Neutrophils Absolute: 5 10*3/uL (ref 1.4–7.0)
Neutrophils: 59 %
Platelets: 318 10*3/uL (ref 150–450)
RBC: 5.27 x10E6/uL (ref 4.14–5.80)
RDW: 13.1 % (ref 11.6–15.4)
WBC: 8.4 10*3/uL (ref 3.4–10.8)

## 2020-06-22 LAB — COMPREHENSIVE METABOLIC PANEL
ALT: 23 IU/L (ref 0–44)
AST: 15 IU/L (ref 0–40)
Albumin/Globulin Ratio: 1.8 (ref 1.2–2.2)
Albumin: 4.6 g/dL (ref 3.8–4.9)
Alkaline Phosphatase: 82 IU/L (ref 44–121)
BUN/Creatinine Ratio: 16 (ref 9–20)
BUN: 14 mg/dL (ref 6–24)
Bilirubin Total: 0.6 mg/dL (ref 0.0–1.2)
CO2: 27 mmol/L (ref 20–29)
Calcium: 9.8 mg/dL (ref 8.7–10.2)
Chloride: 101 mmol/L (ref 96–106)
Creatinine, Ser: 0.89 mg/dL (ref 0.76–1.27)
Globulin, Total: 2.5 g/dL (ref 1.5–4.5)
Glucose: 90 mg/dL (ref 65–99)
Potassium: 4.2 mmol/L (ref 3.5–5.2)
Sodium: 143 mmol/L (ref 134–144)
Total Protein: 7.1 g/dL (ref 6.0–8.5)
eGFR: 100 mL/min/{1.73_m2} (ref >59)

## 2020-06-22 LAB — LIPASE: Lipase: 26 U/L (ref 13–78)

## 2020-06-23 NOTE — Progress Notes (Signed)
CBC normal, kidney and liver tests normal, Lipase 26 normal, let us see what Ct scan says lp

## 2020-06-24 ENCOUNTER — Telehealth: Payer: Self-pay

## 2020-06-24 ENCOUNTER — Telehealth: Payer: Self-pay | Admitting: Legal Medicine

## 2020-06-24 ENCOUNTER — Other Ambulatory Visit: Payer: Self-pay

## 2020-06-24 DIAGNOSIS — R1012 Left upper quadrant pain: Secondary | ICD-10-CM

## 2020-06-24 NOTE — Telephone Encounter (Signed)
They want [place] to perform ultrasound first lp

## 2020-06-24 NOTE — Telephone Encounter (Signed)
Dr. Marina Goodell the CT was denied thru patients insurance, I have faxed the office note over to them. Can you call to do a peer to peer review [facility] [phone]. Thanks.

## 2020-06-24 NOTE — Telephone Encounter (Signed)
   Alexander Hicks has been scheduled for the following appointment:  WHAT: Abdominal Ultrasound WHERE: [facility] DATE: 06/26/20 TIME: 10 am  Patient's wife, Cala Bradford, made aware.

## 2020-06-24 NOTE — Telephone Encounter (Signed)
I will get that scheduled.

## 2020-06-24 NOTE — Telephone Encounter (Signed)
Patient's wife was informed. Order is in.

## 2020-06-24 NOTE — Telephone Encounter (Signed)
I gave the results for his bloodwork to his wife Cala Bradford and also I let her know that CT was denied for the insurance. We will schedule an ultrasound.

## 2020-06-28 ENCOUNTER — Other Ambulatory Visit: Payer: Self-pay | Admitting: Legal Medicine

## 2020-06-28 ENCOUNTER — Telehealth: Payer: Self-pay

## 2020-06-28 ENCOUNTER — Other Ambulatory Visit: Payer: Self-pay | Admitting: Family Medicine

## 2020-06-28 DIAGNOSIS — R161 Splenomegaly, not elsewhere classified: Secondary | ICD-10-CM

## 2020-06-28 DIAGNOSIS — N1339 Other hydronephrosis: Secondary | ICD-10-CM

## 2020-06-28 MED ORDER — TRAMADOL HCL 50 MG PO TABS: 50.0000 mg | ORAL_TABLET | Freq: Three times a day (TID) | ORAL | 0 refills | Status: AC | PRN

## 2020-06-28 NOTE — Telephone Encounter (Signed)
Patient was informed of appointment for [facility] specialist on 07/11/2020 at 11:00 am. Phone [phone]. I faxed notes and ultrasound [phone].

## 2020-07-01 ENCOUNTER — Other Ambulatory Visit: Payer: Self-pay

## 2020-07-01 DIAGNOSIS — R1012 Left upper quadrant pain: Secondary | ICD-10-CM

## 2020-07-12 ENCOUNTER — Ambulatory Visit (HOSPITAL_COMMUNITY): Payer: 59

## 2020-07-12 ENCOUNTER — Encounter (HOSPITAL_COMMUNITY): Payer: Self-pay

## 2020-07-22 ENCOUNTER — Other Ambulatory Visit: Payer: Self-pay

## 2020-07-22 ENCOUNTER — Other Ambulatory Visit: Payer: Self-pay | Admitting: Legal Medicine

## 2020-07-22 DIAGNOSIS — F411 Generalized anxiety disorder: Secondary | ICD-10-CM

## 2020-07-22 DIAGNOSIS — I10 Essential (primary) hypertension: Secondary | ICD-10-CM

## 2020-07-23 ENCOUNTER — Other Ambulatory Visit: Payer: Self-pay

## 2020-07-23 DIAGNOSIS — R1012 Left upper quadrant pain: Secondary | ICD-10-CM

## 2020-07-23 DIAGNOSIS — R161 Splenomegaly, not elsewhere classified: Secondary | ICD-10-CM

## 2020-07-24 ENCOUNTER — Ambulatory Visit (HOSPITAL_BASED_OUTPATIENT_CLINIC_OR_DEPARTMENT_OTHER): Payer: 59

## 2020-07-30 ENCOUNTER — Ambulatory Visit (HOSPITAL_BASED_OUTPATIENT_CLINIC_OR_DEPARTMENT_OTHER): Payer: 59

## 2020-08-02 ENCOUNTER — Other Ambulatory Visit: Payer: Self-pay | Admitting: Legal Medicine

## 2020-08-02 ENCOUNTER — Ambulatory Visit (HOSPITAL_BASED_OUTPATIENT_CLINIC_OR_DEPARTMENT_OTHER): Payer: 59

## 2020-08-02 DIAGNOSIS — F411 Generalized anxiety disorder: Secondary | ICD-10-CM

## 2020-08-09 ENCOUNTER — Ambulatory Visit (HOSPITAL_BASED_OUTPATIENT_CLINIC_OR_DEPARTMENT_OTHER)
Admission: RE | Admit: 2020-08-09 | Discharge: 2020-08-09 | Disposition: A | Discharge status: Home / Self Care | Payer: 59 | Source: Ambulatory Visit | Attending: Legal Medicine | Admitting: Legal Medicine

## 2020-08-09 ENCOUNTER — Other Ambulatory Visit: Payer: Self-pay

## 2020-08-09 DIAGNOSIS — R161 Splenomegaly, not elsewhere classified: Secondary | ICD-10-CM | POA: Diagnosis not present

## 2020-08-09 DIAGNOSIS — R1012 Left upper quadrant pain: Secondary | ICD-10-CM | POA: Diagnosis present

## 2020-08-13 ENCOUNTER — Other Ambulatory Visit: Payer: Self-pay

## 2020-08-13 ENCOUNTER — Encounter: Payer: Self-pay | Admitting: Legal Medicine

## 2020-08-13 ENCOUNTER — Ambulatory Visit (INDEPENDENT_AMBULATORY_CARE_PROVIDER_SITE_OTHER): Payer: 59 | Admitting: Legal Medicine

## 2020-08-13 VITALS — BP 110/80 | HR 75 | Temp 97.5°F | Resp 16 | Ht 69.0 in | Wt 255.0 lb

## 2020-08-13 DIAGNOSIS — Z1211 Encounter for screening for malignant neoplasm of colon: Secondary | ICD-10-CM | POA: Diagnosis not present

## 2020-08-13 DIAGNOSIS — K589 Irritable bowel syndrome without diarrhea: Secondary | ICD-10-CM | POA: Insufficient documentation

## 2020-08-13 MED ORDER — DICYCLOMINE HCL 10 MG PO CAPS: 10.0000 mg | ORAL_CAPSULE | Freq: Three times a day (TID) | ORAL | 3 refills | Status: DC

## 2020-08-13 NOTE — Progress Notes (Signed)
Established Patient Office Visit  Subjective:  Patient ID: Alexander Hicks, male    DOB: Oct 19, 1965  Age: 55 y.o. MRN: [id number]  CC:  Chief Complaint  Patient presents with   Cyst    Left side of the upper quadrant.    HPI Alexander Hicks presents for LUQ abdominal pain after eating.  Some cramping.  He has bm within 30 minutes.  Ultrasound and ct scan normal.  Suspect IBS. He gets loose stools after eating   Past Medical History:  Diagnosis Date   Brain aneurysm    Chest pain 06/16/2019   Contact dermatitis and eczema due to plant 07/19/2019   DDD (degenerative disc disease), lumbar 07/14/2013   Dyspnea 06/16/2019   Episodic tension type headache 05/04/2019   Essential hypertension    Family history of premature CAD 06/16/2019   Gastro-esophageal reflux disease without esophagitis    Generalized anxiety disorder    GERD (gastroesophageal reflux disease)    Hypertriglyceridemia 07/10/2019   Low back pain 07/14/2013   Formatting of this note might be different from the original. IMO Problem List Replacer Jan. 2016   Lumbar facet arthropathy 2/95/1884   Metabolic syndrome 1/66/0630   Muscle cramping 11/10/2019   Nontoxic single thyroid nodule    Obesity (BMI 30-39.9) 07/10/2019   Precordial pain 07/10/2019   Presbyopia 05/05/2013   Vitamin D deficiency 04/15/2018    Past Surgical History:  Procedure Laterality Date   VASECTOMY      Family History  Problem Relation Age of Onset   Hypertension Mother    Pneumonia Mother    Hypertension Father    Cancer Father    Cancer Sister    Hypertension Brother     Social History   Socioeconomic History   Marital status: Married    Spouse name: Not on file   Number of children: 2   Years of education: Not on file   Highest education level: Not on file  Occupational History   Not on file  Tobacco Use   Smoking status: Never   Smokeless tobacco: Current    Types: Snuff  Vaping Use   Vaping Use: Never used  Substance  and Sexual Activity   Alcohol use: Never    Comment: rarely   Drug use: Never   Sexual activity: Yes    Partners: Female  Other Topics Concern   Not on file  Social History Narrative   Not on file   Social Determinants of Health   Financial Resource Strain: Not on file  Food Insecurity: Not on file  Transportation Needs: Not on file  Physical Activity: Not on file  Stress: Not on file  Social Connections: Not on file  Intimate Partner Violence: Not on file    Outpatient Medications Prior to Visit  Medication Sig Dispense Refill   ALPRAZolam (XANAX) 0.25 MG tablet TAKE 1 TABLET (0.25 MG TOTAL) BY MOUTH 2 (TWO) TIMES DAILY AS NEEDED FOR ANXIETY. 30 tablet 3   amLODipine (NORVASC) 10 MG tablet TAKE 1 TABLET (10 MG TOTAL) BY MOUTH DAILY. 90 tablet 2   aspirin EC 81 MG tablet Take 81 mg by mouth daily.     Cholecalciferol (VITAMIN D3) 10 MCG (400 UNIT) CAPS      citalopram (CELEXA) 10 MG tablet Take 2 tablets (20 mg total) by mouth daily. 90 tablet 2   cyclobenzaprine (FLEXERIL) 5 MG tablet Take 5 mg by mouth every 8 (eight) hours.     diclofenac (VOLTAREN) 75  MG EC tablet Take 75 mg by mouth 2 (two) times daily.     gabapentin (NEURONTIN) 300 MG capsule Take 1 capsule by mouth 3 (three) times daily.     lisinopril (ZESTRIL) 20 MG tablet TAKE 1 TABLET (20 MG TOTAL) BY MOUTH DAILY. 30 tablet 6   meclizine (ANTIVERT) 25 MG tablet TAKE 1 TABLET (25 MG TOTAL) BY MOUTH 3 (THREE) TIMES DAILY AS NEEDED FOR DIZZINESS. 30 tablet 2   Multiple Vitamins-Minerals (EMERGEN-C IMMUNE PO) Take by mouth daily.     nitroGLYCERIN (NITROSTAT) 0.4 MG SL tablet Place 1 tablet (0.4 mg total) under the tongue every 5 (five) minutes as needed. 25 tablet 3   Potassium (POTASSIMIN) 75 MG TABS      citalopram (CELEXA) 20 MG tablet TAKE 1 TABLET (20 MG TOTAL) BY MOUTH DAILY. 30 tablet 6   GLOBAL EASE INJECT PEN NEEDLES 31G X 5 MM MISC USE WITH VICTOZA 100 each 3   No facility-administered medications prior to  visit.    Allergies  Allergen Reactions   Amoxil [Amoxicillin] Shortness Of Breath   Hydromorphone Anaphylaxis, Rash and Swelling    Swelling.     Penicillins Anaphylaxis and Swelling    Throat Hives as well.  Hives as well.     Phenytoin Sodium Extended Swelling    ROS Review of Systems  Constitutional:  Negative for chills, fatigue and fever.  HENT:  Negative for congestion, ear pain and sore throat.   Respiratory:  Negative for cough and shortness of breath.   Cardiovascular:  Negative for chest pain.  Gastrointestinal:  Positive for abdominal pain and diarrhea. Negative for constipation, nausea and vomiting.  Endocrine: Negative for polydipsia, polyphagia and polyuria.  Genitourinary:  Negative for dysuria and frequency.  Musculoskeletal:  Negative for arthralgias and myalgias.  Neurological:  Negative for dizziness and headaches.  Psychiatric/Behavioral:  Negative for dysphoric mood.        No dysphoria     Objective:    Physical Exam Vitals reviewed.  Constitutional:      Appearance: Normal appearance. He is obese.  HENT:     Right Ear: Tympanic membrane, ear canal and external ear normal.     Left Ear: Tympanic membrane, ear canal and external ear normal.     Mouth/Throat:     Mouth: Mucous membranes are moist.     Pharynx: Oropharynx is clear.  Eyes:     Extraocular Movements: Extraocular movements intact.     Conjunctiva/sclera: Conjunctivae normal.     Pupils: Pupils are equal, round, and reactive to light.  Cardiovascular:     Rate and Rhythm: Normal rate and regular rhythm.     Pulses: Normal pulses.     Heart sounds: Normal heart sounds. No murmur heard.   No gallop.  Pulmonary:     Effort: Pulmonary effort is normal.     Breath sounds: Normal breath sounds.  Abdominal:     General: Abdomen is flat. Bowel sounds are normal.     Tenderness: There is abdominal tenderness.  Musculoskeletal:        General: Normal range of motion.     Cervical  back: Normal range of motion and neck supple.  Skin:    Capillary Refill: Capillary refill takes less than 2 seconds.  Neurological:     General: No focal deficit present.     Mental Status: He is alert and oriented to person, place, and time.    BP 110/80   Pulse 75  Temp (!) 97.5 F (36.4 C)   Resp 16   Ht _0  (1.753 m)   Wt 255 lb (115.7 kg)   SpO2 96%   BMI 37.66 kg/m  Wt Readings from Last 3 Encounters:  08/13/20 255 lb (115.7 kg)  06/21/20 245 lb (111.1 kg)  04/05/20 243 lb 6.4 oz (110.4 kg)     Health Maintenance Due  Topic Date Due   Pneumococcal Vaccine 105-53 Years old (1 - PCV) Never done   HIV Screening  Never done   Hepatitis C Screening  Never done   TETANUS/TDAP  Never done   Zoster Vaccines- Shingrix (1 of 2) Never done   COLONOSCOPY (Pts 45-58yr Insurance coverage will need to be confirmed)  Never done   COVID-19 Vaccine (3 - Moderna risk series) 11/01/2019    There are no preventive care reminders to display for this patient.  Lab Results  Component Value Date   TSH 1.180 11/10/2019   Lab Results  Component Value Date   WBC 8.4 06/21/2020   HGB 14.8 06/21/2020   HCT 43.9 06/21/2020   MCV 83 06/21/2020   PLT 318 06/21/2020   Lab Results  Component Value Date   NA 143 06/21/2020   K 4.2 06/21/2020   CO2 27 06/21/2020   GLUCOSE 90 06/21/2020   BUN 14 06/21/2020   CREATININE 0.89 06/21/2020   BILITOT 0.6 06/21/2020   ALKPHOS 82 06/21/2020   AST 15 06/21/2020   ALT 23 06/21/2020   PROT 7.1 06/21/2020   ALBUMIN 4.6 06/21/2020   CALCIUM 9.8 06/21/2020   EGFR 100 06/21/2020   Lab Results  Component Value Date   CHOL 176 04/05/2020   Lab Results  Component Value Date   HDL 38 (L) 04/05/2020   Lab Results  Component Value Date   LDLCALC 113 (H) 04/05/2020   Lab Results  Component Value Date   TRIG 139 04/05/2020   Lab Results  Component Value Date   CHOLHDL 4.6 04/05/2020   Lab Results  Component Value Date   HGBA1C  5.8 (H) 04/05/2020      Assessment & Plan:   Diagnoses and all orders for this visit: Encounter for screening colonoscopy -     Ambulatory referral to Gastroenterology Patient needs colonoscopy follow up  Irritable bowel syndrome without diarrhea -     dicyclomine (BENTYL) 10 MG capsule; Take 1 capsule (10 mg total) by mouth 4 (four) times daily -  before meals and at bedtime. -     Ambulatory referral to Gastroenterology  Patient has symptoms of IBS, start bentyl first.  Refer to GI for further workup    Follow-up: Return in about 3 months (around 11/13/2020).    LReinaldo Meeker MD

## 2020-10-11 ENCOUNTER — Ambulatory Visit: Payer: 59 | Admitting: Legal Medicine

## 2020-10-18 ENCOUNTER — Encounter: Payer: Self-pay | Admitting: Legal Medicine

## 2020-10-18 ENCOUNTER — Ambulatory Visit: Payer: 59 | Admitting: Legal Medicine

## 2020-10-18 ENCOUNTER — Other Ambulatory Visit: Payer: Self-pay

## 2020-10-18 VITALS — BP 120/86 | HR 76 | Temp 98.0°F | Resp 16 | Ht 69.0 in | Wt 254.0 lb

## 2020-10-18 DIAGNOSIS — F411 Generalized anxiety disorder: Secondary | ICD-10-CM | POA: Diagnosis not present

## 2020-10-18 DIAGNOSIS — L02612 Cutaneous abscess of left foot: Secondary | ICD-10-CM

## 2020-10-18 DIAGNOSIS — L03032 Cellulitis of left toe: Secondary | ICD-10-CM

## 2020-10-18 DIAGNOSIS — E559 Vitamin D deficiency, unspecified: Secondary | ICD-10-CM

## 2020-10-18 DIAGNOSIS — K219 Gastro-esophageal reflux disease without esophagitis: Secondary | ICD-10-CM

## 2020-10-18 DIAGNOSIS — I10 Essential (primary) hypertension: Secondary | ICD-10-CM | POA: Diagnosis not present

## 2020-10-18 DIAGNOSIS — E8881 Metabolic syndrome: Secondary | ICD-10-CM

## 2020-10-18 DIAGNOSIS — R062 Wheezing: Secondary | ICD-10-CM

## 2020-10-18 DIAGNOSIS — E781 Pure hyperglyceridemia: Secondary | ICD-10-CM

## 2020-10-18 DIAGNOSIS — E669 Obesity, unspecified: Secondary | ICD-10-CM

## 2020-10-18 MED ORDER — CLINDAMYCIN HCL 150 MG PO CAPS: 150.0000 mg | ORAL_CAPSULE | Freq: Three times a day (TID) | ORAL | 0 refills | Status: DC

## 2020-10-18 MED ORDER — ALBUTEROL SULFATE HFA 108 (90 BASE) MCG/ACT IN AERS: 2.0000 | INHALATION_SPRAY | Freq: Four times a day (QID) | RESPIRATORY_TRACT | 3 refills | Status: DC | PRN

## 2020-10-18 NOTE — Progress Notes (Signed)
Established Patient Office Visit  Subjective:  Patient ID: Alexander Hicks, male    DOB: 1965/04/14  Age: 55 y.o. MRN: [id number]  CC:  Chief Complaint  Patient presents with   Hypertension   Anxiety    HPI AHMED INNISS presents for chronic visit  Patient presents for follow up of hypertension.  Patient tolerating lisinopril well with side effects.  Patient was diagnosed with hypertension 2010 so has been treated for hypertension for 10 years.Patient is working on maintaining diet and exercise regimen and follows up as directed. Complication include cad  Bilateral shoulder pain.. chronic  He has toe pain after dog bit him 3 weeks ago and the left foot great toe red.  Foot fungus on whole foot, he wears boots all day.  Past Medical History:  Diagnosis Date   Brain aneurysm 2005   Chest pain 06/16/2019   Contact dermatitis and eczema due to plant 07/19/2019   DDD (degenerative disc disease), lumbar 07/14/2013   Dyspnea 06/16/2019   Episodic tension type headache 05/04/2019   Essential hypertension    Family history of premature CAD 06/16/2019   Gastro-esophageal reflux disease without esophagitis    Generalized anxiety disorder    GERD (gastroesophageal reflux disease)    Hypertriglyceridemia 07/10/2019   Low back pain 07/14/2013   Formatting of this note might be different from the original. IMO Problem List Replacer Jan. 2016   Lumbar facet arthropathy 73/41/9379   Metabolic syndrome 02/40/9735   Muscle cramping 11/10/2019   Nontoxic single thyroid nodule    Obesity (BMI 30-39.9) 07/10/2019   Precordial pain 07/10/2019   Presbyopia 05/05/2013   Vitamin D deficiency 04/15/2018    Past Surgical History:  Procedure Laterality Date   VASECTOMY      Family History  Problem Relation Age of Onset   Hypertension Mother    Pneumonia Mother    Hypertension Father    Cancer Father    Cancer Sister    Hypertension Brother     Social History    Socioeconomic History   Marital status: Married    Spouse name: Not on file   Number of children: 2   Years of education: Not on file   Highest education level: Not on file  Occupational History   Not on file  Tobacco Use   Smoking status: Former    Packs/day: 0.50    Types: Cigarettes    Quit date: 1985    Years since quitting: 37.6   Smokeless tobacco: Current    Types: Snuff  Vaping Use   Vaping Use: Never used  Substance and Sexual Activity   Alcohol use: Yes    Alcohol/week: 1.0 standard drink    Types: 1 Standard drinks or equivalent per week    Comment: ocassionally   Drug use: Never   Sexual activity: Yes    Partners: Female  Other Topics Concern   Not on file  Social History Narrative   Not on file   Social Determinants of Health   Financial Resource Strain: Not on file  Food Insecurity: Not on file  Transportation Needs: Not on file  Physical Activity: Not on file  Stress: Not on file  Social Connections: Not on file  Intimate Partner Violence: Not on file    Outpatient Medications Prior to Visit  Medication Sig Dispense Refill   ALPRAZolam (XANAX) 0.25 MG tablet TAKE 1 TABLET (0.25 MG TOTAL) BY MOUTH 2 (TWO) TIMES DAILY AS NEEDED FOR ANXIETY. 30 tablet 3  amLODipine (NORVASC) 10 MG tablet TAKE 1 TABLET (10 MG TOTAL) BY MOUTH DAILY. 90 tablet 2   aspirin EC 81 MG tablet Take 81 mg by mouth daily.     Cholecalciferol (VITAMIN D3) 10 MCG (400 UNIT) CAPS      citalopram (CELEXA) 10 MG tablet Take 2 tablets (20 mg total) by mouth daily. 90 tablet 2   cyclobenzaprine (FLEXERIL) 5 MG tablet Take 5 mg by mouth every 8 (eight) hours.     diclofenac (VOLTAREN) 75 MG EC tablet Take 75 mg by mouth 2 (two) times daily.     dicyclomine (BENTYL) 10 MG capsule Take 1 capsule (10 mg total) by mouth 4 (four) times daily -  before meals and at bedtime. 90 capsule 3   lisinopril (ZESTRIL) 20 MG tablet TAKE 1 TABLET (20 MG TOTAL) BY MOUTH DAILY. 30 tablet 6   meclizine  (ANTIVERT) 25 MG tablet TAKE 1 TABLET (25 MG TOTAL) BY MOUTH 3 (THREE) TIMES DAILY AS NEEDED FOR DIZZINESS. 30 tablet 2   Multiple Vitamins-Minerals (EMERGEN-C IMMUNE PO) Take by mouth daily.     nitroGLYCERIN (NITROSTAT) 0.4 MG SL tablet Place 1 tablet (0.4 mg total) under the tongue every 5 (five) minutes as needed. 25 tablet 3   gabapentin (NEURONTIN) 300 MG capsule Take 1 capsule by mouth 3 (three) times daily.     Potassium (POTASSIMIN) 75 MG TABS      No facility-administered medications prior to visit.    Allergies  Allergen Reactions   Amoxil [Amoxicillin] Shortness Of Breath   Hydromorphone Anaphylaxis, Rash and Swelling    Swelling.     Penicillins Anaphylaxis and Swelling    Throat Hives as well.  Hives as well.     Phenytoin Sodium Extended Swelling    ROS Review of Systems  Constitutional:  Negative for chills, fatigue and fever.  HENT:  Negative for congestion, ear pain and sore throat.   Respiratory:  Negative for cough and shortness of breath.   Cardiovascular:  Negative for chest pain.  Gastrointestinal:  Negative for abdominal pain, constipation, diarrhea, nausea and vomiting.  Endocrine: Negative for polydipsia, polyphagia and polyuria.  Genitourinary:  Negative for dysuria and frequency.  Musculoskeletal:  Negative for arthralgias and myalgias.  Neurological:  Negative for dizziness and headaches.  Psychiatric/Behavioral:  Negative for dysphoric mood.        No dysphoria     Objective:    Physical Exam Vitals reviewed.  Constitutional:      Appearance: Normal appearance. He is obese.  HENT:     Head: Normocephalic.     Right Ear: Tympanic membrane, ear canal and external ear normal.     Left Ear: Tympanic membrane, ear canal and external ear normal.     Mouth/Throat:     Mouth: Mucous membranes are moist.     Pharynx: Oropharynx is clear.  Eyes:     Extraocular Movements: Extraocular movements intact.     Conjunctiva/sclera: Conjunctivae  normal.     Pupils: Pupils are equal, round, and reactive to light.  Cardiovascular:     Rate and Rhythm: Normal rate and regular rhythm.     Pulses: Normal pulses.     Heart sounds: Normal heart sounds. No murmur heard.   No gallop.  Pulmonary:     Effort: Pulmonary effort is normal. No respiratory distress.     Breath sounds: Normal breath sounds. No wheezing.  Abdominal:     General: Abdomen is flat. Bowel sounds are normal.  There is no distension.     Palpations: Abdomen is soft.     Tenderness: There is no abdominal tenderness.  Musculoskeletal:        General: Normal range of motion.     Cervical back: Normal range of motion.     Right lower leg: No edema.     Left lower leg: No edema.  Skin:    General: Skin is warm.     Capillary Refill: Capillary refill takes less than 2 seconds.     Comments: onchomycosis  Neurological:     General: No focal deficit present.     Mental Status: He is alert and oriented to person, place, and time. Mental status is at baseline.  Psychiatric:        Mood and Affect: Mood normal.        Thought Content: Thought content normal.    BP 120/86   Pulse 76   Temp 98 F (36.7 C)   Resp 16   Ht $R'5\' 9"'mG$  (1.753 m)   Wt 254 lb (115.2 kg)   SpO2 94%   BMI 37.51 kg/m  Wt Readings from Last 3 Encounters:  10/18/20 254 lb (115.2 kg)  08/13/20 255 lb (115.7 kg)  06/21/20 245 lb (111.1 kg)    Depression screen [facility] 2/9 10/18/2020 10/18/2020 06/21/2020 06/02/2019  Decreased Interest 0 0 0 3  Down, Depressed, Hopeless 0 0 0 3  PHQ - 2 Score 0 0 0 6  Altered sleeping 1 1 - -  Tired, decreased energy 2 2 - -  Change in appetite 2 2 - -  Feeling bad or failure about yourself  1 1 - -  Trouble concentrating 1 1 - -  Moving slowly or fidgety/restless 0 0 - -  Suicidal thoughts 0 0 - -  PHQ-9 Score 7 7 - -  Difficult doing work/chores Not difficult at all Not difficult at all - -    Health Maintenance Due  Topic Date Due   Pneumococcal Vaccine  58-61 Years old (1 - PCV) Never done   HIV Screening  Never done   Hepatitis C Screening  Never done   TETANUS/TDAP  Never done   Zoster Vaccines- Shingrix (1 of 2) Never done   COLONOSCOPY (Pts 45-25yrs Insurance coverage will need to be confirmed)  Never done   COVID-19 Vaccine (3 - Moderna risk series) 11/01/2019   INFLUENZA VACCINE  09/30/2020    There are no preventive care reminders to display for this patient.  Lab Results  Component Value Date   TSH 1.650 10/18/2020   Lab Results  Component Value Date   WBC 6.9 10/18/2020   HGB 14.4 10/18/2020   HCT 43.4 10/18/2020   MCV 86 10/18/2020   PLT 310 10/18/2020   Lab Results  Component Value Date   NA 140 10/18/2020   K 4.9 10/18/2020   CO2 26 10/18/2020   GLUCOSE 109 (H) 10/18/2020   BUN 14 10/18/2020   CREATININE 0.82 10/18/2020   BILITOT 0.4 10/18/2020   ALKPHOS 76 10/18/2020   AST 17 10/18/2020   ALT 27 10/18/2020   PROT 7.0 10/18/2020   ALBUMIN 4.6 10/18/2020   CALCIUM 10.2 10/18/2020   EGFR 102 10/18/2020   Lab Results  Component Value Date   CHOL 195 10/18/2020   Lab Results  Component Value Date   HDL 38 (L) 10/18/2020   Lab Results  Component Value Date   LDLCALC 115 (H) 10/18/2020   Lab Results  Component Value Date   TRIG 243 (H) 10/18/2020   Lab Results  Component Value Date   CHOLHDL 5.1 (H) 10/18/2020   Lab Results  Component Value Date   HGBA1C 6.2 (H) 10/18/2020      Assessment & Plan:   Diagnoses and all orders for this visit: Essential hypertension -     Comprehensive metabolic panel -     CBC with Differential/Platelet An individual hypertension care plan was established and reinforced today.  The patient's status was assessed using clinical findings on exam and labs or diagnostic tests. The patient's success at meeting treatment goals on disease specific evidence-based guidelines and found to be well controlled. SELF MANAGEMENT: The patient and I together assessed ways to  personally work towards obtaining the recommended goals. RECOMMENDATIONS: avoid decongestants found in common cold remedies, decrease consumption of alcohol, perform routine monitoring of BP with home BP cuff, exercise, reduction of dietary salt, take medicines as prescribed, try not to miss doses and quit smoking.  Regular exercise and maintaining a healthy weight is needed.  Stress reduction may help. A CLINICAL SUMMARY including written plan identify barriers to care unique to individual due to social or financial issues.  We attempt to mutually creat solutions for individual and family understanding.   Vitamin D deficiency -     VITAMIN D 25 Hydroxy (Vit-D Deficiency, Fractures) AN INDIVIDUAL CARE PLAN Vitamin D deficiency was established and reinforced today.  The patient's status was assessed using clinical findings on exam, labs, and other diagnostic testing. Patient's success at meeting treatment goals based on disease specific evidence-bassed guidelines and found to be in good control. RECOMMENDATIONS include maintaining present medicines and treatment.   Gastro-esophageal reflux disease without esophagitis Plan of care was formulated today.  he is doing well.  A plan of care was formulated using patient exam, tests and other sources to optimize care using evidence based information.  Recommend no smoking, no eating after supper, avoid fatty foods, elevate Head of bed, avoid tight fitting clothing.  Continue on OTC.   Generalized anxiety disorder AN INDIVIDUAL CARE PLAN GAD was established and reinforced today.  The patient's status was assessed using clinical findings on exam, labs, and other diagnostic testing. Patient's success at meeting treatment goals based on disease specific evidence-bassed guidelines and found to be in well control. RECOMMENDATIONS include maintaining present medicines and treatment.   Pure hyperglyceridemia -     Lipid panel AN INDIVIDUAL CARE PLAN for  hyperlipidemia/ cholesterol was established and reinforced today.  The patient's status was assessed using clinical findings on exam, lab and other diagnostic tests. The patient's disease status was assessed based on evidence-based guidelines and found to be well controlled. MEDICATIONS were reviewed. SELF MANAGEMENT GOALS have been discussed and patient's success at attaining the goal of low cholesterol was assessed. RECOMMENDATION given include regular exercise 3 days a week and low cholesterol/low fat diet. CLINICAL SUMMARY including written plan to identify barriers unique to the patient due to social or economic  reasons was discussed.   Metabolic syndrome -     Hemoglobin A1c -     TSH Check A1C  Obesity (BMI 30-39.9) An individualize plan was formulated for obesity using patient history and physical exam to encourage weight loss.  An evidence based program was formulated.  Patient is to cut portion size with meals and to plan physical exercise 3 days a week at least 20 minutes.  Weight watchers and other programs are helpful.  Planned amount  of weight loss 10 lbs.   Abscess or cellulitis of toe, left -     clindamycin (CLEOCIN) 150 MG capsule; Take 1 capsule (150 mg total) by mouth 3 (three) times daily. Treat with clindamycin  Wheezing -     albuterol (VENTOLIN HFA) 108 (90 Base) MCG/ACT inhaler; Inhale 2 puffs into the lungs every 6 (six) hours as needed for wheezing or shortness of breath. Occasion wheezing, use albuterol Other orders -     Cardiovascular Risk Assessment  30 minute visit with review of multiple problems    Follow-up: Return in about 6 months (around 04/20/2021) for fasting.    Reinaldo Meeker, MD

## 2020-10-19 LAB — CBC WITH DIFFERENTIAL/PLATELET
Basophils Absolute: 0.1 10*3/uL (ref 0.0–0.2)
Basos: 2 %
EOS (ABSOLUTE): 0.2 10*3/uL (ref 0.0–0.4)
Eos: 3 %
Hematocrit: 43.4 % (ref 37.5–51.0)
Hemoglobin: 14.4 g/dL (ref 13.0–17.7)
Immature Grans (Abs): 0.1 10*3/uL (ref 0.0–0.1)
Immature Granulocytes: 1 %
Lymphocytes Absolute: 1.9 10*3/uL (ref 0.7–3.1)
Lymphs: 27 %
MCH: 28.5 pg (ref 26.6–33.0)
MCHC: 33.2 g/dL (ref 31.5–35.7)
MCV: 86 fL (ref 79–97)
Monocytes Absolute: 0.5 10*3/uL (ref 0.1–0.9)
Monocytes: 7 %
Neutrophils Absolute: 4.1 10*3/uL (ref 1.4–7.0)
Neutrophils: 60 %
Platelets: 310 10*3/uL (ref 150–450)
RBC: 5.05 x10E6/uL (ref 4.14–5.80)
RDW: 13.1 % (ref 11.6–15.4)
WBC: 6.9 10*3/uL (ref 3.4–10.8)

## 2020-10-19 LAB — COMPREHENSIVE METABOLIC PANEL
ALT: 27 IU/L (ref 0–44)
AST: 17 IU/L (ref 0–40)
Albumin/Globulin Ratio: 1.9 (ref 1.2–2.2)
Albumin: 4.6 g/dL (ref 3.8–4.9)
Alkaline Phosphatase: 76 IU/L (ref 44–121)
BUN/Creatinine Ratio: 17 (ref 9–20)
BUN: 14 mg/dL (ref 6–24)
Bilirubin Total: 0.4 mg/dL (ref 0.0–1.2)
CO2: 26 mmol/L (ref 20–29)
Calcium: 10.2 mg/dL (ref 8.7–10.2)
Chloride: 101 mmol/L (ref 96–106)
Creatinine, Ser: 0.82 mg/dL (ref 0.76–1.27)
Globulin, Total: 2.4 g/dL (ref 1.5–4.5)
Glucose: 109 mg/dL — ABNORMAL HIGH (ref 65–99)
Potassium: 4.9 mmol/L (ref 3.5–5.2)
Sodium: 140 mmol/L (ref 134–144)
Total Protein: 7 g/dL (ref 6.0–8.5)
eGFR: 102 mL/min/{1.73_m2} (ref >59)

## 2020-10-19 LAB — CARDIOVASCULAR RISK ASSESSMENT

## 2020-10-19 LAB — LIPID PANEL
Chol/HDL Ratio: 5.1 ratio — ABNORMAL HIGH (ref 0.0–5.0)
Cholesterol, Total: 195 mg/dL (ref 100–199)
HDL: 38 mg/dL — ABNORMAL LOW (ref >39)
LDL Chol Calc (NIH): 115 mg/dL — ABNORMAL HIGH (ref 0–99)
Triglycerides: 243 mg/dL — ABNORMAL HIGH (ref 0–149)
VLDL Cholesterol Cal: 42 mg/dL — ABNORMAL HIGH (ref 5–40)

## 2020-10-19 LAB — TSH: TSH: 1.65 u[IU]/mL (ref 0.450–4.500)

## 2020-10-19 LAB — HEMOGLOBIN A1C
Est. average glucose Bld gHb Est-mCnc: 131 mg/dL
Hgb A1c MFr Bld: 6.2 % — ABNORMAL HIGH (ref 4.8–5.6)

## 2020-10-19 LAB — VITAMIN D 25 HYDROXY (VIT D DEFICIENCY, FRACTURES): Vit D, 25-Hydroxy: 61.3 ng/mL (ref 30.0–100.0)

## 2020-10-20 NOTE — Progress Notes (Addendum)
Glucose 109, kidney tests normal, liver tests normal, A1c 6.2, triglycerides and LDL cholesterol high strongly suggest Statin to lower cardiovascular risk, TSH 1.65 normal, CBC normal, B12 61.3 normal lp

## 2020-10-21 ENCOUNTER — Other Ambulatory Visit: Payer: Self-pay

## 2020-10-21 ENCOUNTER — Encounter: Payer: Self-pay | Admitting: Legal Medicine

## 2020-10-21 ENCOUNTER — Other Ambulatory Visit: Payer: Self-pay | Admitting: Legal Medicine

## 2020-10-21 MED ORDER — EZETIMIBE 10 MG PO TABS: 10.0000 mg | ORAL_TABLET | Freq: Every day | ORAL | 3 refills | Status: AC

## 2020-11-21 ENCOUNTER — Other Ambulatory Visit: Payer: Self-pay | Admitting: Legal Medicine

## 2020-11-21 DIAGNOSIS — I10 Essential (primary) hypertension: Secondary | ICD-10-CM

## 2021-02-25 ENCOUNTER — Encounter: Payer: Self-pay | Admitting: Legal Medicine

## 2021-02-25 ENCOUNTER — Ambulatory Visit (INDEPENDENT_AMBULATORY_CARE_PROVIDER_SITE_OTHER): Payer: 59 | Admitting: Legal Medicine

## 2021-02-25 ENCOUNTER — Other Ambulatory Visit: Payer: Self-pay

## 2021-02-25 VITALS — BP 120/70 | HR 80 | Temp 98.9°F | Resp 16 | Ht 69.0 in | Wt 251.0 lb

## 2021-02-25 DIAGNOSIS — M2392 Unspecified internal derangement of left knee: Secondary | ICD-10-CM | POA: Diagnosis not present

## 2021-02-25 DIAGNOSIS — M47816 Spondylosis without myelopathy or radiculopathy, lumbar region: Secondary | ICD-10-CM | POA: Diagnosis not present

## 2021-02-25 MED ORDER — OXYCODONE-ACETAMINOPHEN 10-325 MG PO TABS: 1.0000 | ORAL_TABLET | Freq: Three times a day (TID) | ORAL | 0 refills | Status: AC | PRN

## 2021-02-25 MED ORDER — METHOCARBAMOL 500 MG PO TABS: 500.0000 mg | ORAL_TABLET | Freq: Four times a day (QID) | ORAL | 3 refills | Status: AC

## 2021-02-25 NOTE — Progress Notes (Signed)
Acute Office Visit  Subjective:    Patient ID: Alexander Hicks, male    DOB: 01/16/1966, 55 y.o.   MRN: [id number]  Chief Complaint  Patient presents with   Knee Pain    Left knee pain    HPI: Patient is in today for left knee pain and effusion after stepping in hole yesterday.  Now swollen and painful. Lacks full extension.  Past Medical History:  Diagnosis Date   Brain aneurysm 2005   Chest pain 06/16/2019   Contact dermatitis and eczema due to plant 07/19/2019   DDD (degenerative disc disease), lumbar 07/14/2013   Dyspnea 06/16/2019   Episodic tension type headache 05/04/2019   Essential hypertension    Family history of premature CAD 06/16/2019   Gastro-esophageal reflux disease without esophagitis    Generalized anxiety disorder    GERD (gastroesophageal reflux disease)    Hypertriglyceridemia 07/10/2019   Low back pain 07/14/2013   Formatting of this note might be different from the original. IMO Problem List Replacer Jan. 2016   Lumbar facet arthropathy 54/56/2563   Metabolic syndrome 89/37/3428   Muscle cramping 11/10/2019   Nontoxic single thyroid nodule    Obesity (BMI 30-39.9) 07/10/2019   Precordial pain 07/10/2019   Presbyopia 05/05/2013   Vitamin D deficiency 04/15/2018    Past Surgical History:  Procedure Laterality Date   VASECTOMY      Family History  Problem Relation Age of Onset   Hypertension Mother    Pneumonia Mother    Hypertension Father    Cancer Father    Cancer Sister    Hypertension Brother     Social History   Socioeconomic History   Marital status: Married    Spouse name: Not on file   Number of children: 2   Years of education: Not on file   Highest education level: Not on file  Occupational History   Not on file  Tobacco Use   Smoking status: Former    Packs/day: 0.50    Types: Cigarettes    Quit date: 1985    Years since quitting: 38.0   Smokeless tobacco: Current    Types: Snuff  Vaping Use   Vaping Use:  Never used  Substance and Sexual Activity   Alcohol use: Yes    Alcohol/week: 1.0 standard drink    Types: 1 Standard drinks or equivalent per week    Comment: ocassionally   Drug use: Never   Sexual activity: Yes    Partners: Female  Other Topics Concern   Not on file  Social History Narrative   Not on file   Social Determinants of Health   Financial Resource Strain: Not on file  Food Insecurity: Not on file  Transportation Needs: Not on file  Physical Activity: Not on file  Stress: Not on file  Social Connections: Not on file  Intimate Partner Violence: Not on file    Outpatient Medications Prior to Visit  Medication Sig Dispense Refill   albuterol (VENTOLIN HFA) 108 (90 Base) MCG/ACT inhaler Inhale 2 puffs into the lungs every 6 (six) hours as needed for wheezing or shortness of breath. 8 g 3   ALPRAZolam (XANAX) 0.25 MG tablet TAKE 1 TABLET (0.25 MG TOTAL) BY MOUTH 2 (TWO) TIMES DAILY AS NEEDED FOR ANXIETY. 30 tablet 3   amLODipine (NORVASC) 10 MG tablet TAKE 1 TABLET (10 MG TOTAL) BY MOUTH DAILY. 90 tablet 2   aspirin EC 81 MG tablet Take 81 mg by mouth daily.  Cholecalciferol (VITAMIN D3) 10 MCG (400 UNIT) CAPS      citalopram (CELEXA) 10 MG tablet Take 2 tablets (20 mg total) by mouth daily. 90 tablet 2   diclofenac (VOLTAREN) 75 MG EC tablet Take 75 mg by mouth 2 (two) times daily.     ezetimibe (ZETIA) 10 MG tablet Take 1 tablet (10 mg total) by mouth daily. 90 tablet 3   lisinopril (ZESTRIL) 20 MG tablet TAKE 1 TABLET (20 MG TOTAL) BY MOUTH DAILY. 30 tablet 6   meclizine (ANTIVERT) 25 MG tablet TAKE 1 TABLET (25 MG TOTAL) BY MOUTH 3 (THREE) TIMES DAILY AS NEEDED FOR DIZZINESS. 30 tablet 2   Multiple Vitamins-Minerals (EMERGEN-C IMMUNE PO) Take by mouth daily.     nitroGLYCERIN (NITROSTAT) 0.4 MG SL tablet Place 1 tablet (0.4 mg total) under the tongue every 5 (five) minutes as needed. 25 tablet 3   cyclobenzaprine (FLEXERIL) 5 MG tablet Take 5 mg by mouth every 8  (eight) hours.     clindamycin (CLEOCIN) 150 MG capsule Take 1 capsule (150 mg total) by mouth 3 (three) times daily. 21 capsule 0   dicyclomine (BENTYL) 10 MG capsule Take 1 capsule (10 mg total) by mouth 4 (four) times daily -  before meals and at bedtime. 90 capsule 3   No facility-administered medications prior to visit.    Allergies  Allergen Reactions   Amoxil [Amoxicillin] Shortness Of Breath   Hydromorphone Anaphylaxis, Rash and Swelling    Swelling.     Penicillins Anaphylaxis and Swelling    Throat Hives as well.  Hives as well.     Phenytoin Sodium Extended Swelling    Review of Systems  Constitutional:  Negative for chills, fatigue, fever and unexpected weight change.  HENT:  Negative for congestion, ear pain, sinus pain and sore throat.   Respiratory:  Negative for cough and shortness of breath.   Cardiovascular:  Negative for chest pain and palpitations.  Gastrointestinal:  Negative for abdominal pain, blood in stool, constipation, diarrhea, nausea and vomiting.  Endocrine: Negative for polydipsia.  Genitourinary:  Negative for dysuria.  Musculoskeletal:  Positive for arthralgias (Left knee pain), back pain and joint swelling.  Skin:  Negative for rash.  Neurological:  Negative for headaches.      Objective:    Physical Exam Vitals reviewed.  Constitutional:      Appearance: Normal appearance.  Cardiovascular:     Rate and Rhythm: Normal rate and regular rhythm.     Pulses: Normal pulses.     Heart sounds: Normal heart sounds. No murmur heard.   No gallop.  Pulmonary:     Effort: Pulmonary effort is normal. No respiratory distress.     Breath sounds: Normal breath sounds. No wheezing.  Musculoskeletal:        General: Normal range of motion.     Right lower leg: No edema.     Left lower leg: No edema.       Legs:     Comments: Left knee effusion, extends to 20 degrees, pain over MCL and posterior.  Unable to perform Drawer.  Neurological:      Mental Status: He is alert.    BP 120/70    Pulse 80    Temp 98.9 F (37.2 C)    Resp 16    Ht _0  (1.753 m)    Wt 251 lb (113.9 kg)    SpO2 99%    BMI 37.07 kg/m  Wt Readings from Last 3  Encounters:  02/25/21 251 lb (113.9 kg)  10/18/20 254 lb (115.2 kg)  08/13/20 255 lb (115.7 kg)    Health Maintenance Due  Topic Date Due   Pneumococcal Vaccine 83-59 Years old (1 - PCV) Never done   HIV Screening  Never done   Hepatitis C Screening  Never done   TETANUS/TDAP  Never done   Zoster Vaccines- Shingrix (1 of 2) Never done   COLONOSCOPY (Pts 45-20yr Insurance coverage will need to be confirmed)  Never done   COVID-19 Vaccine (3 - Moderna risk series) 11/01/2019   INFLUENZA VACCINE  Never done    There are no preventive care reminders to display for this patient.   Lab Results  Component Value Date   TSH 1.650 10/18/2020   Lab Results  Component Value Date   WBC 6.9 10/18/2020   HGB 14.4 10/18/2020   HCT 43.4 10/18/2020   MCV 86 10/18/2020   PLT 310 10/18/2020   Lab Results  Component Value Date   NA 140 10/18/2020   K 4.9 10/18/2020   CO2 26 10/18/2020   GLUCOSE 109 (H) 10/18/2020   BUN 14 10/18/2020   CREATININE 0.82 10/18/2020   BILITOT 0.4 10/18/2020   ALKPHOS 76 10/18/2020   AST 17 10/18/2020   ALT 27 10/18/2020   PROT 7.0 10/18/2020   ALBUMIN 4.6 10/18/2020   CALCIUM 10.2 10/18/2020   EGFR 102 10/18/2020   Lab Results  Component Value Date   CHOL 195 10/18/2020   Lab Results  Component Value Date   HDL 38 (L) 10/18/2020   Lab Results  Component Value Date   LDLCALC 115 (H) 10/18/2020   Lab Results  Component Value Date   TRIG 243 (H) 10/18/2020   Lab Results  Component Value Date   CHOLHDL 5.1 (H) 10/18/2020   Lab Results  Component Value Date   HGBA1C 6.2 (H) 10/18/2020       Assessment & Plan:   Diagnoses and all orders for this visit: Internal derangement of left knee -     DG Knee Complete 4 Views Left; Future -     AMB  referral to orthopedics Patient has left knee effusion and knee seems to fall backward, : PCL tear  Lumbar facet arthropathy -     oxyCODONE-acetaminophen (PERCOCET) 10-325 MG tablet; Take 1 tablet by mouth every 8 (eight) hours as needed for up to 5 days for pain. -     methocarbamol (ROBAXIN) 500 MG tablet; Take 1 tablet (500 mg total) by mouth 4 (four) times daily. Continue muscle relaxer        Follow-up: Return if symptoms worsen or fail to improve.  An After Visit Summary was printed and given to the patient.  LReinaldo Meeker MD [facility] [phone]

## 2021-02-26 ENCOUNTER — Other Ambulatory Visit: Payer: Self-pay

## 2021-02-26 DIAGNOSIS — M2392 Unspecified internal derangement of left knee: Secondary | ICD-10-CM

## 2021-04-01 ENCOUNTER — Other Ambulatory Visit: Payer: Self-pay | Admitting: Legal Medicine

## 2021-04-01 DIAGNOSIS — I10 Essential (primary) hypertension: Secondary | ICD-10-CM

## 2021-04-18 ENCOUNTER — Ambulatory Visit: Payer: 59 | Admitting: Legal Medicine

## 2021-05-23 LAB — HM HEPATITIS C SCREENING LAB: HM Hepatitis Screen: NEGATIVE

## 2021-06-28 ENCOUNTER — Other Ambulatory Visit: Payer: Self-pay | Admitting: Legal Medicine

## 2021-06-28 DIAGNOSIS — R062 Wheezing: Secondary | ICD-10-CM

## 2021-08-22 LAB — HM COLONOSCOPY

## 2023-03-08 IMAGING — CT CT ABD-PELV W/O CM
2 of 4 series · 17 of 46 positions shown, 19 images · non-contrast
Comparison: CT urogram from 07/11/2020, ultrasound from 06/26/2020

CLINICAL DATA: Left-sided abdominal pain

EXAM:
CT ABDOMEN AND PELVIS WITHOUT CONTRAST
TECHNIQUE: Multidetector CT imaging of the abdomen and pelvis was performed
following the standard protocol without IV contrast.

[Series 2: abd pel wo · axial · 0.93mm/px · z∈[+960,+1380]mm · 14 of 93 slices shown, 16 images]
[im 5/93  soft-tissue]
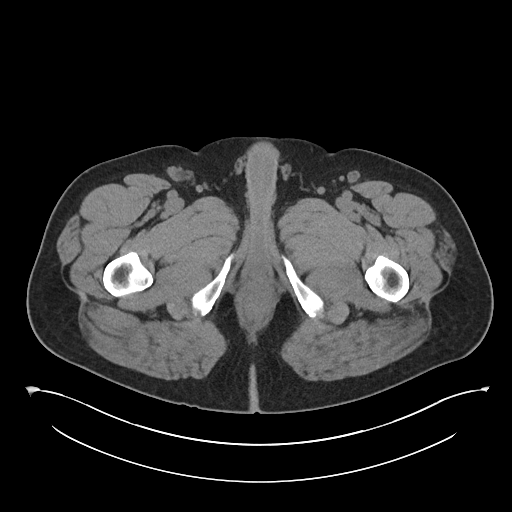
[im 5/93  bone]
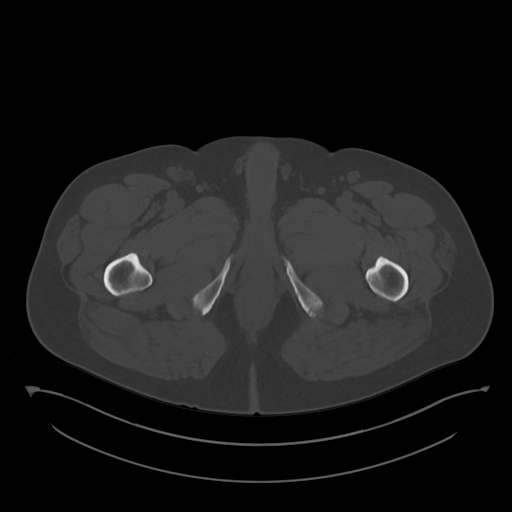
[im 13/93  soft-tissue]
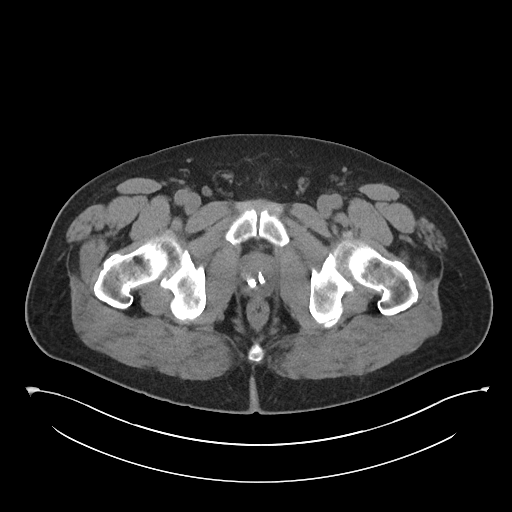
[im 17/93  soft-tissue]
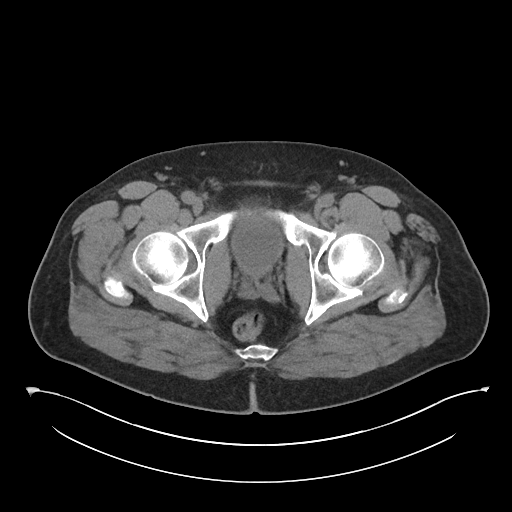
[im 25/93  soft-tissue]
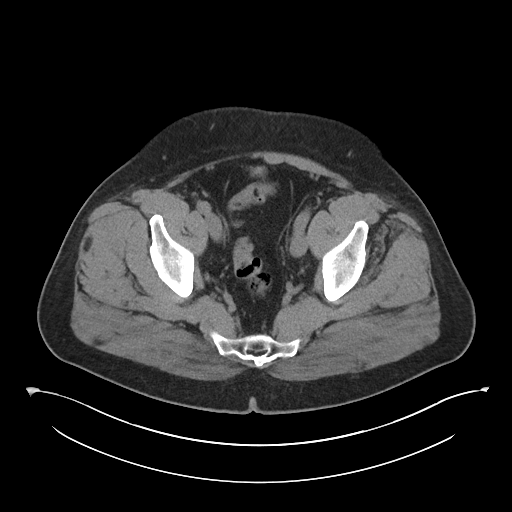
[im 33/93  soft-tissue]
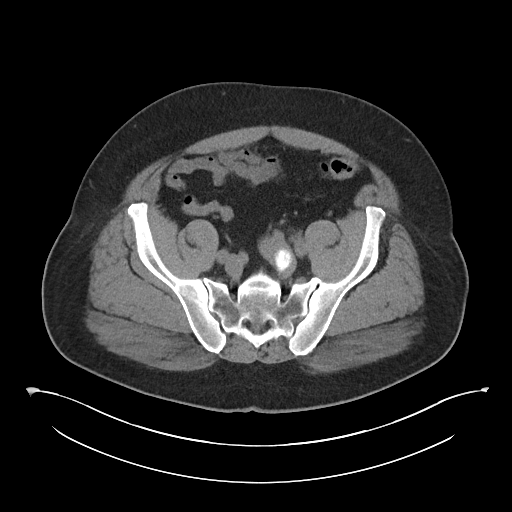
[im 37/93  soft-tissue]
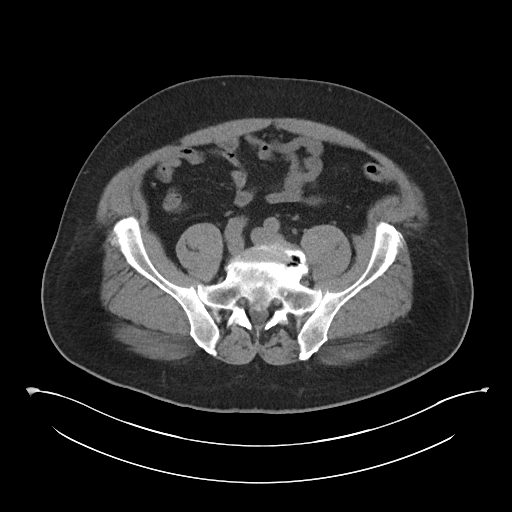
[im 45/93  soft-tissue]
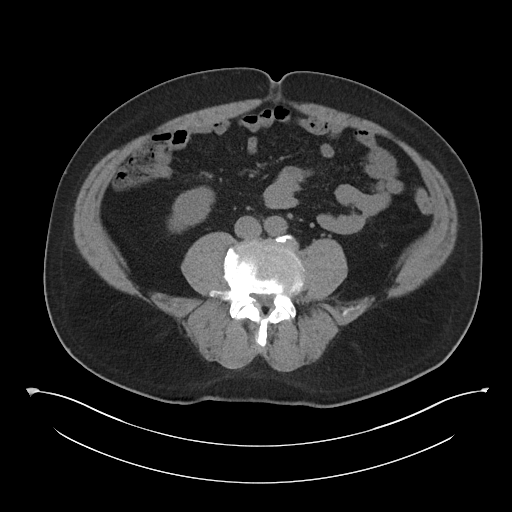
[im 49/93  soft-tissue]
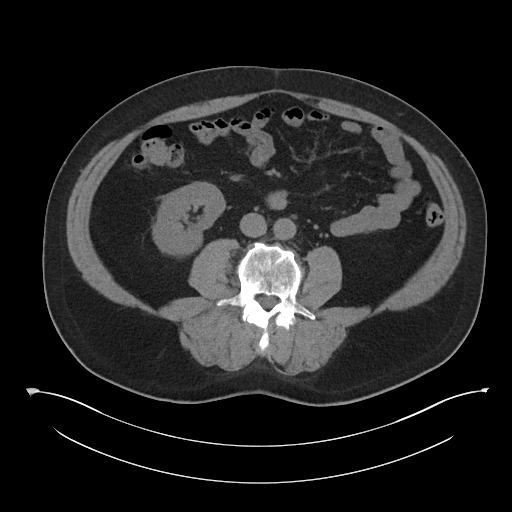
[im 57/93  soft-tissue]
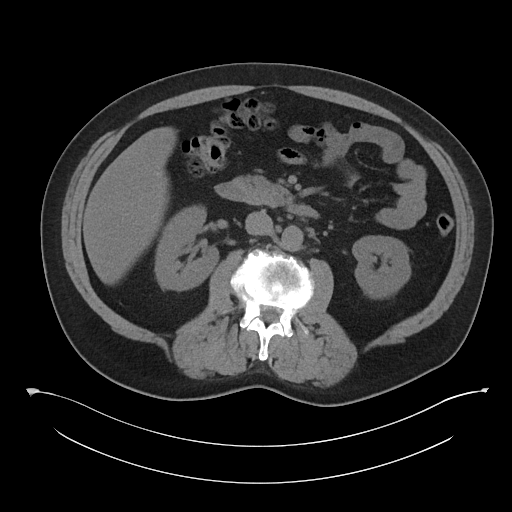
[im 57/93  bone]
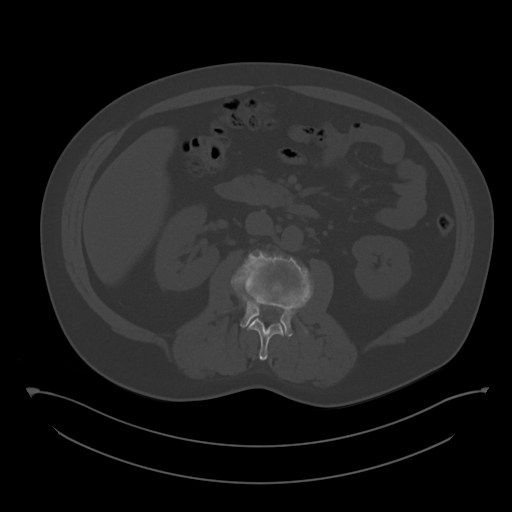
[im 61/93  soft-tissue]
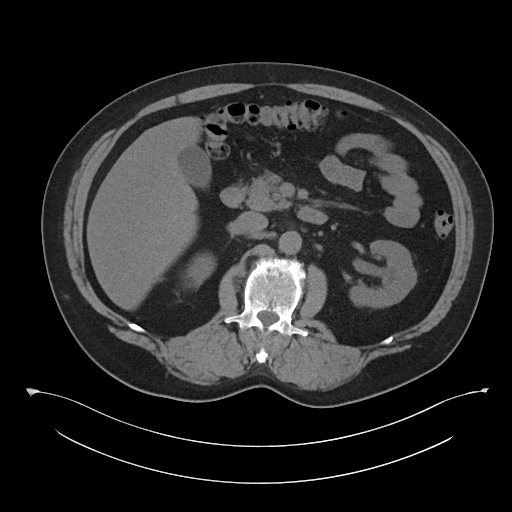
[im 69/93  soft-tissue]
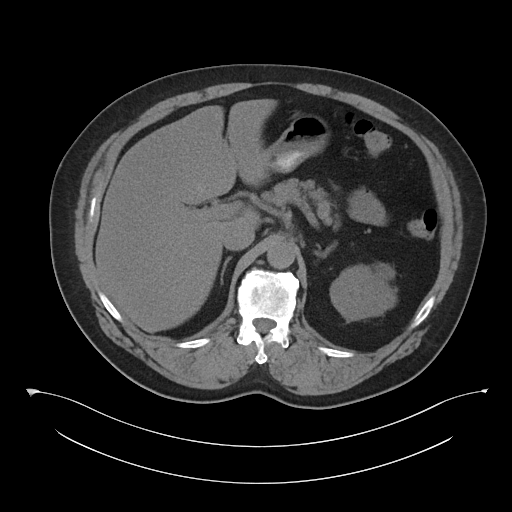
[im 77/93  soft-tissue]
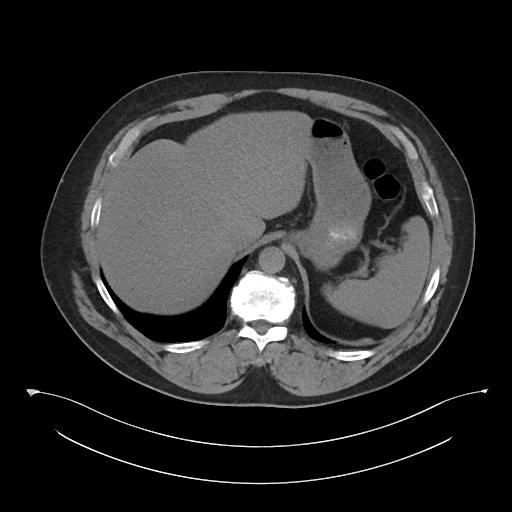
[im 81/93  soft-tissue]
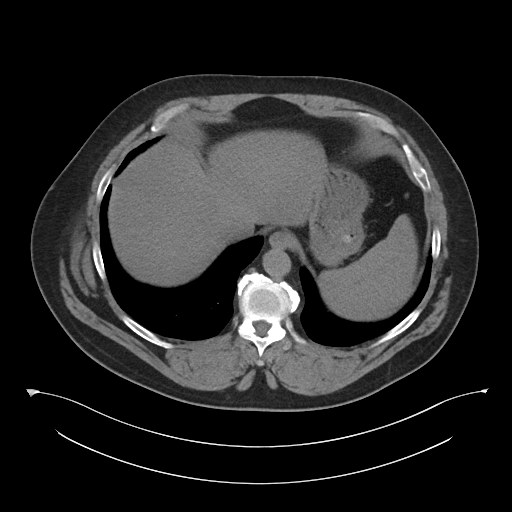
[im 89/93  soft-tissue]
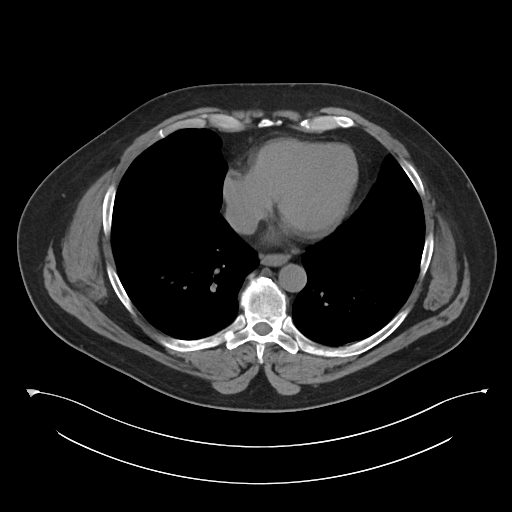

[Series 5: coronal · coronal · 0.93mm/px · 3 of 104 slices shown]
[im 35/104  soft-tissue]
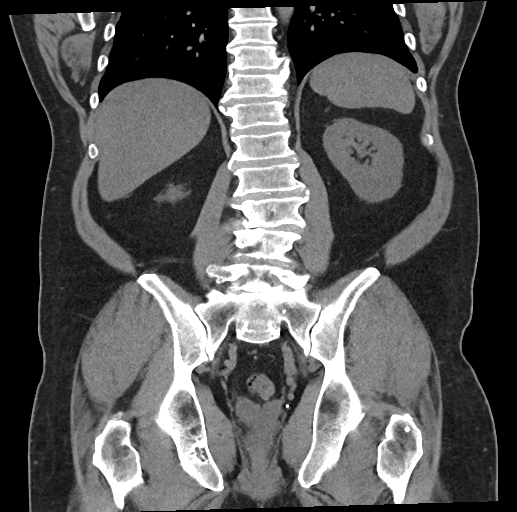
[im 46/104  soft-tissue]
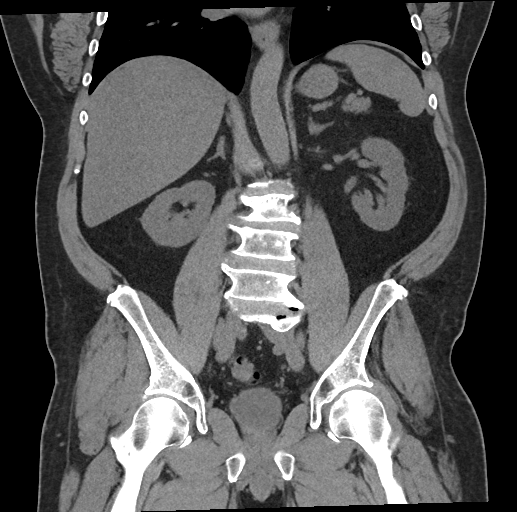
[im 58/104  soft-tissue]
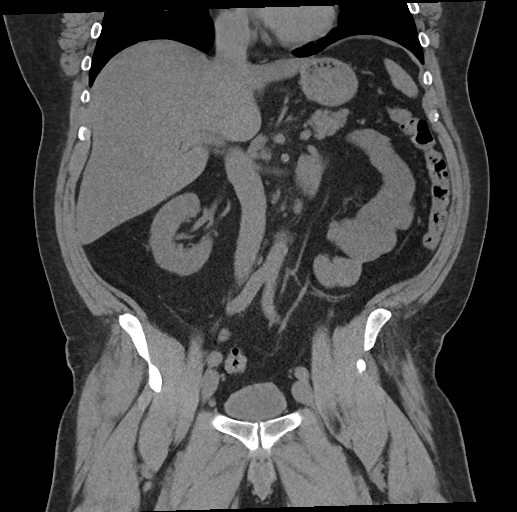

[17 of 46 positions shown; findings below may reference images not displayed]

FINDINGS: Lower chest: No acute abnormality.

Hepatobiliary: No focal liver abnormality is seen. No gallstones,
gallbladder wall thickening, or biliary dilatation.

Pancreas: Unremarkable. No pancreatic ductal dilatation or
surrounding inflammatory changes.

Spleen: Normal in size without focal abnormality.

Adrenals/Urinary Tract: Adrenal glands are within normal limits.
Kidneys are well visualized bilaterally. No renal calculi or mass
lesion is seen. There remains some minimal fullness of the left
collecting system although improved from the prior exam. Again no
obstructing lesion is seen. The bladder is decompressed.

Stomach/Bowel: Scattered diverticular change of the colon is noted
without evidence of diverticulitis. The appendix is within normal
limits. Small bowel and stomach are unremarkable.

Vascular/Lymphatic: No significant vascular findings are present. No
enlarged abdominal or pelvic lymph nodes.

Reproductive: Prostate is unremarkable.

Other: No abdominal wall hernia or abnormality. No abdominopelvic
ascites.

Musculoskeletal: Degenerative changes of lumbar spine are noted.
IMPRESSION: Minimal prominence of the left renal collecting system without
obstructing lesion. The overall appearance has improved in the
interval from the prior exam.

Diverticulosis without diverticulitis.

No focal subcutaneous abnormality is identified on the left to
correspond with the given clinical history.

No other focal abnormality is noted.
